# Patient Record
Sex: Female | Born: 1945
Health system: Southern US, Community
[De-identification: ages and names within clinical notes are randomized; demographics above are authoritative.]

## PROBLEM LIST (undated history)

## (undated) DIAGNOSIS — R519 Headache, unspecified: Secondary | ICD-10-CM

## (undated) DIAGNOSIS — M353 Polymyalgia rheumatica: Secondary | ICD-10-CM

## (undated) DIAGNOSIS — M722 Plantar fascial fibromatosis: Secondary | ICD-10-CM

## (undated) DIAGNOSIS — I7300 Raynaud's syndrome without gangrene: Secondary | ICD-10-CM

## (undated) DIAGNOSIS — R319 Hematuria, unspecified: Secondary | ICD-10-CM

## (undated) DIAGNOSIS — K635 Polyp of colon: Secondary | ICD-10-CM

## (undated) DIAGNOSIS — F419 Anxiety disorder, unspecified: Secondary | ICD-10-CM

## (undated) DIAGNOSIS — M810 Age-related osteoporosis without current pathological fracture: Secondary | ICD-10-CM

## (undated) DIAGNOSIS — C50919 Malignant neoplasm of unspecified site of unspecified female breast: Secondary | ICD-10-CM

## (undated) DIAGNOSIS — K219 Gastro-esophageal reflux disease without esophagitis: Secondary | ICD-10-CM

## (undated) DIAGNOSIS — C801 Malignant (primary) neoplasm, unspecified: Secondary | ICD-10-CM

## (undated) DIAGNOSIS — R42 Dizziness and giddiness: Secondary | ICD-10-CM

## (undated) DIAGNOSIS — M1990 Unspecified osteoarthritis, unspecified site: Secondary | ICD-10-CM

## (undated) DIAGNOSIS — Z923 Personal history of irradiation: Secondary | ICD-10-CM

## (undated) HISTORY — DX: Plantar fascial fibromatosis: M72.2

## (undated) HISTORY — PX: HAND SURGERY: SHX662

## (undated) HISTORY — PX: TUBAL LIGATION: SHX77

## (undated) HISTORY — PX: NASAL SINUS SURGERY: SHX719

## (undated) HISTORY — PX: LYMPH NODE DISSECTION: SHX5087

## (undated) HISTORY — DX: Polyp of colon: K63.5

---

## 1996-04-29 HISTORY — PX: BREAST EXCISIONAL BIOPSY: SUR124

## 1999-07-17 ENCOUNTER — Ambulatory Visit (HOSPITAL_BASED_OUTPATIENT_CLINIC_OR_DEPARTMENT_OTHER): Admission: RE | Admit: 1999-07-17 | Discharge: 1999-07-17 | Payer: Self-pay | Admitting: Orthopedic Surgery

## 2001-04-30 ENCOUNTER — Other Ambulatory Visit: Admission: RE | Admit: 2001-04-30 | Discharge: 2001-04-30 | Payer: Self-pay | Admitting: Family Medicine

## 2004-09-26 ENCOUNTER — Ambulatory Visit: Payer: Self-pay | Admitting: Family Medicine

## 2005-06-05 ENCOUNTER — Ambulatory Visit: Payer: Self-pay | Admitting: Cardiology

## 2005-10-21 ENCOUNTER — Ambulatory Visit: Payer: Self-pay

## 2005-10-23 ENCOUNTER — Ambulatory Visit: Payer: Self-pay

## 2006-03-06 ENCOUNTER — Ambulatory Visit: Payer: Self-pay | Admitting: Gastroenterology

## 2006-03-06 LAB — HM COLONOSCOPY: HM Colonoscopy: NORMAL

## 2006-11-05 ENCOUNTER — Ambulatory Visit: Payer: Self-pay | Admitting: Family Medicine

## 2007-11-25 LAB — HM PAP SMEAR: HM Pap smear: NORMAL

## 2008-01-11 ENCOUNTER — Ambulatory Visit: Payer: Self-pay

## 2008-04-14 ENCOUNTER — Ambulatory Visit: Payer: Self-pay | Admitting: Gastroenterology

## 2008-05-10 ENCOUNTER — Ambulatory Visit: Payer: Self-pay | Admitting: Gastroenterology

## 2008-07-20 ENCOUNTER — Ambulatory Visit: Payer: Self-pay | Admitting: Family Medicine

## 2008-08-16 ENCOUNTER — Ambulatory Visit: Payer: Self-pay | Admitting: Otolaryngology

## 2008-08-18 ENCOUNTER — Ambulatory Visit: Payer: Self-pay | Admitting: Otolaryngology

## 2009-02-21 ENCOUNTER — Ambulatory Visit: Payer: Self-pay | Admitting: Family Medicine

## 2009-02-21 LAB — HM DEXA SCAN

## 2009-03-06 ENCOUNTER — Ambulatory Visit: Payer: Self-pay | Admitting: Family Medicine

## 2010-07-11 ENCOUNTER — Ambulatory Visit: Payer: Self-pay | Admitting: Family Medicine

## 2010-07-11 LAB — HM MAMMOGRAPHY: HM Mammogram: NORMAL

## 2010-12-20 ENCOUNTER — Ambulatory Visit: Payer: Self-pay | Admitting: Family Medicine

## 2011-07-04 ENCOUNTER — Ambulatory Visit: Payer: Self-pay | Admitting: Family Medicine

## 2011-08-14 ENCOUNTER — Ambulatory Visit: Payer: Self-pay | Admitting: Family Medicine

## 2012-06-01 DIAGNOSIS — N319 Neuromuscular dysfunction of bladder, unspecified: Secondary | ICD-10-CM | POA: Insufficient documentation

## 2012-06-01 DIAGNOSIS — R300 Dysuria: Secondary | ICD-10-CM | POA: Insufficient documentation

## 2012-06-01 DIAGNOSIS — N3020 Other chronic cystitis without hematuria: Secondary | ICD-10-CM | POA: Insufficient documentation

## 2012-06-03 DIAGNOSIS — N3946 Mixed incontinence: Secondary | ICD-10-CM | POA: Insufficient documentation

## 2012-06-03 DIAGNOSIS — R339 Retention of urine, unspecified: Secondary | ICD-10-CM | POA: Insufficient documentation

## 2012-06-18 ENCOUNTER — Ambulatory Visit: Payer: Self-pay | Admitting: Family Medicine

## 2012-12-09 ENCOUNTER — Ambulatory Visit: Payer: Self-pay | Admitting: Family Medicine

## 2013-03-02 ENCOUNTER — Emergency Department: Payer: Self-pay | Admitting: Emergency Medicine

## 2013-03-02 LAB — COMPREHENSIVE METABOLIC PANEL
Alkaline Phosphatase: 67 U/L (ref 50–136)
BUN: 14 mg/dL (ref 7–18)
Bilirubin,Total: 0.3 mg/dL (ref 0.2–1.0)
Calcium, Total: 9.8 mg/dL (ref 8.5–10.1)
Chloride: 107 mmol/L (ref 98–107)
Co2: 25 mmol/L (ref 21–32)
Creatinine: 0.89 mg/dL (ref 0.60–1.30)
EGFR (African American): >60
Osmolality: 278 (ref 275–301)
Potassium: 3.5 mmol/L (ref 3.5–5.1)
SGOT(AST): 23 U/L (ref 15–37)
SGPT (ALT): 25 U/L (ref 12–78)
Sodium: 138 mmol/L (ref 136–145)
Total Protein: 7.4 g/dL (ref 6.4–8.2)

## 2013-03-02 LAB — CBC
HCT: 34.1 % — ABNORMAL LOW (ref 35.0–47.0)
HGB: 11.8 g/dL — ABNORMAL LOW (ref 12.0–16.0)
MCH: 31.7 pg (ref 26.0–34.0)
MCHC: 34.7 g/dL (ref 32.0–36.0)
MCV: 91 fL (ref 80–100)
Platelet: 196 10*3/uL (ref 150–440)
RBC: 3.73 10*6/uL — ABNORMAL LOW (ref 3.80–5.20)
WBC: 6.5 10*3/uL (ref 3.6–11.0)

## 2013-03-02 LAB — TROPONIN I: Troponin-I: <0.02 ng/mL

## 2013-03-02 LAB — CK TOTAL AND CKMB (NOT AT ARMC): CK, Total: 167 U/L (ref 21–215)

## 2013-10-26 ENCOUNTER — Ambulatory Visit: Payer: Self-pay | Admitting: Family Medicine

## 2014-01-13 LAB — HEPATIC FUNCTION PANEL
ALK PHOS: 47 U/L (ref 25–125)
ALT: 12 U/L (ref 7–35)
AST: 13 U/L (ref 13–35)

## 2014-01-13 LAB — BASIC METABOLIC PANEL
BUN: 17 mg/dL (ref 4–21)
CREATININE: 0.7 mg/dL (ref 0.5–1.1)
GLUCOSE: 96 mg/dL
POTASSIUM: 4.2 mmol/L (ref 3.4–5.3)
SODIUM: 141 mmol/L (ref 137–147)

## 2014-01-13 LAB — CBC AND DIFFERENTIAL
HCT: 35 % — AB (ref 36–46)
HEMOGLOBIN: 11.3 g/dL — AB (ref 12.0–16.0)
Neutrophils Absolute: 2 /uL
Platelets: 218 10*3/uL (ref 150–399)
WBC: 5.1 10^3/mL

## 2014-01-13 LAB — LIPID PANEL
Cholesterol: 170 mg/dL (ref 0–200)
HDL: 70 mg/dL (ref 35–70)
LDL Cholesterol: 76 mg/dL
LDL/HDL RATIO: 1.1
TRIGLYCERIDES: 120 mg/dL (ref 40–160)

## 2014-01-13 LAB — TSH: TSH: 2.85 u[IU]/mL (ref 0.41–5.90)

## 2014-03-01 ENCOUNTER — Ambulatory Visit: Payer: Self-pay | Admitting: Family Medicine

## 2014-09-07 ENCOUNTER — Other Ambulatory Visit: Payer: Self-pay | Admitting: Specialist

## 2014-09-07 DIAGNOSIS — M25562 Pain in left knee: Secondary | ICD-10-CM

## 2014-09-14 ENCOUNTER — Ambulatory Visit
Admission: RE | Admit: 2014-09-14 | Discharge: 2014-09-14 | Disposition: A | Discharge status: Home / Self Care | Payer: Medicare Other | Source: Ambulatory Visit | Attending: Specialist | Admitting: Specialist

## 2014-09-14 DIAGNOSIS — M25462 Effusion, left knee: Secondary | ICD-10-CM | POA: Diagnosis not present

## 2014-09-14 DIAGNOSIS — M25562 Pain in left knee: Secondary | ICD-10-CM

## 2014-09-14 DIAGNOSIS — S83242A Other tear of medial meniscus, current injury, left knee, initial encounter: Secondary | ICD-10-CM | POA: Insufficient documentation

## 2014-09-14 DIAGNOSIS — M948X6 Other specified disorders of cartilage, lower leg: Secondary | ICD-10-CM | POA: Insufficient documentation

## 2014-10-24 DIAGNOSIS — R350 Frequency of micturition: Secondary | ICD-10-CM | POA: Insufficient documentation

## 2014-10-24 DIAGNOSIS — M791 Myalgia, unspecified site: Secondary | ICD-10-CM | POA: Insufficient documentation

## 2014-10-24 DIAGNOSIS — N390 Urinary tract infection, site not specified: Secondary | ICD-10-CM

## 2014-10-24 DIAGNOSIS — D649 Anemia, unspecified: Secondary | ICD-10-CM | POA: Insufficient documentation

## 2014-10-24 DIAGNOSIS — R319 Hematuria, unspecified: Secondary | ICD-10-CM | POA: Insufficient documentation

## 2014-10-24 DIAGNOSIS — M1990 Unspecified osteoarthritis, unspecified site: Secondary | ICD-10-CM | POA: Insufficient documentation

## 2014-10-24 DIAGNOSIS — F419 Anxiety disorder, unspecified: Secondary | ICD-10-CM | POA: Insufficient documentation

## 2014-10-24 DIAGNOSIS — R7989 Other specified abnormal findings of blood chemistry: Secondary | ICD-10-CM | POA: Insufficient documentation

## 2014-10-24 DIAGNOSIS — G43909 Migraine, unspecified, not intractable, without status migrainosus: Secondary | ICD-10-CM | POA: Insufficient documentation

## 2014-10-24 DIAGNOSIS — F32A Depression, unspecified: Secondary | ICD-10-CM | POA: Insufficient documentation

## 2014-10-24 DIAGNOSIS — B9620 Unspecified Escherichia coli [E. coli] as the cause of diseases classified elsewhere: Secondary | ICD-10-CM | POA: Insufficient documentation

## 2014-10-24 DIAGNOSIS — D229 Melanocytic nevi, unspecified: Secondary | ICD-10-CM | POA: Insufficient documentation

## 2014-10-24 DIAGNOSIS — F329 Major depressive disorder, single episode, unspecified: Secondary | ICD-10-CM | POA: Insufficient documentation

## 2014-10-24 DIAGNOSIS — I7300 Raynaud's syndrome without gangrene: Secondary | ICD-10-CM | POA: Insufficient documentation

## 2014-10-24 DIAGNOSIS — H6980 Other specified disorders of Eustachian tube, unspecified ear: Secondary | ICD-10-CM | POA: Insufficient documentation

## 2014-10-24 DIAGNOSIS — N059 Unspecified nephritic syndrome with unspecified morphologic changes: Secondary | ICD-10-CM | POA: Insufficient documentation

## 2014-10-24 DIAGNOSIS — R42 Dizziness and giddiness: Secondary | ICD-10-CM | POA: Insufficient documentation

## 2014-10-24 DIAGNOSIS — E7800 Pure hypercholesterolemia, unspecified: Secondary | ICD-10-CM | POA: Insufficient documentation

## 2014-10-24 DIAGNOSIS — S161XXA Strain of muscle, fascia and tendon at neck level, initial encounter: Secondary | ICD-10-CM | POA: Insufficient documentation

## 2014-10-27 ENCOUNTER — Telehealth: Payer: Self-pay | Admitting: Family Medicine

## 2014-10-27 NOTE — Telephone Encounter (Signed)
Pt having back pain and wants something called in that Dolores Frame use to call in for her.  Pt Cb # [phone]  tp

## 2014-10-27 NOTE — Telephone Encounter (Signed)
Left a voicemail advising patient per Simona Huh she will need a OV to evaluate back pain.

## 2014-10-28 ENCOUNTER — Telehealth: Payer: Self-pay | Admitting: Family Medicine

## 2014-10-28 ENCOUNTER — Other Ambulatory Visit: Payer: Self-pay

## 2014-10-28 DIAGNOSIS — M1990 Unspecified osteoarthritis, unspecified site: Secondary | ICD-10-CM

## 2014-10-28 MED ORDER — CARISOPRODOL 350 MG PO TABS: ORAL_TABLET | ORAL | Status: DC

## 2014-10-28 NOTE — Telephone Encounter (Signed)
Called pt to advised RX was ready for pick up. Pt request it be faxed to [facility] per Raquel Sarna we can not fax but can call it in and Raquel Sarna stated she will call in the RX for Soma 350 mg. Pt advised. Thanks TNP

## 2014-11-10 ENCOUNTER — Ambulatory Visit: Payer: Self-pay | Admitting: Family Medicine

## 2015-01-04 ENCOUNTER — Ambulatory Visit (INDEPENDENT_AMBULATORY_CARE_PROVIDER_SITE_OTHER): Payer: Medicare Other | Admitting: Family Medicine

## 2015-01-04 ENCOUNTER — Encounter: Payer: Self-pay | Admitting: Family Medicine

## 2015-01-04 VITALS — BP 122/68 | HR 72 | Temp 98.2°F | Resp 16 | Ht 61.5 in | Wt 119.2 lb

## 2015-01-04 DIAGNOSIS — G4700 Insomnia, unspecified: Secondary | ICD-10-CM | POA: Diagnosis not present

## 2015-01-04 DIAGNOSIS — R5383 Other fatigue: Secondary | ICD-10-CM

## 2015-01-04 DIAGNOSIS — M722 Plantar fascial fibromatosis: Secondary | ICD-10-CM | POA: Diagnosis not present

## 2015-01-04 DIAGNOSIS — Z23 Encounter for immunization: Secondary | ICD-10-CM | POA: Diagnosis not present

## 2015-01-04 DIAGNOSIS — Z0000 Encounter for general adult medical examination without abnormal findings: Secondary | ICD-10-CM | POA: Diagnosis not present

## 2015-01-04 DIAGNOSIS — E785 Hyperlipidemia, unspecified: Secondary | ICD-10-CM | POA: Diagnosis not present

## 2015-01-04 DIAGNOSIS — F32A Depression, unspecified: Secondary | ICD-10-CM

## 2015-01-04 DIAGNOSIS — F329 Major depressive disorder, single episode, unspecified: Secondary | ICD-10-CM | POA: Diagnosis not present

## 2015-01-04 LAB — POCT URINALYSIS DIPSTICK
Bilirubin, UA: NEGATIVE
GLUCOSE UA: NEGATIVE
Ketones, UA: NEGATIVE
Leukocytes, UA: NEGATIVE
NITRITE UA: NEGATIVE
PH UA: 6.5
Protein, UA: NEGATIVE
Spec Grav, UA: 1.015
UROBILINOGEN UA: 0.2

## 2015-01-04 NOTE — Progress Notes (Signed)
Patient ID: Sherri Cisneros, female   DOB: Jun 12, 1945, 69 y.o.   MRN: [id number] [name], Female    DOB: [birth year], 69 y.o.   MRN: [id number] Visit Date: 01/04/2015  Today's Provider: Wilhemena Durie, MD   Chief Complaint  Patient presents with  . Annual Exam   Subjective:  Sherri Cisneros is a 69 y.o. female who presents today for health maintenance and complete physical. She feels fairly well. She reports exercising 3 times weekly. She reports she is sleeping well.  Mammo- 03/01/2014  BMD- 07/04/2011 osteopenia  Pap- 07/02/2011 normal with HPV  Colonoscopy- 03/06/2006 normal repeat in 10 years  EKG- 12/20/2010  Patient needs flu shot and prevnar   Review of Systems  Constitutional: Positive for fatigue.  HENT: Positive for sinus pressure and tinnitus.   Eyes: Positive for discharge, redness and itching.  Respiratory: Negative.   Cardiovascular: Positive for leg swelling.  Gastrointestinal: Negative.   Endocrine: Positive for cold intolerance.  Genitourinary: Positive for hematuria.  Musculoskeletal: Positive for myalgias, back pain, joint swelling and arthralgias.  Skin: Negative.   Allergic/Immunologic: Positive for environmental allergies and food allergies.  Neurological: Positive for numbness.  Hematological: Negative.   Psychiatric/Behavioral: Negative.   All other systems reviewed and are negative.   Social History   Social History  . Marital Status: Married    Spouse Name: N/A  . Number of Children: 1  . Years of Education: N/A   Occupational History  . Not on file.   Social History Main Topics  . Smoking status: Never Smoker   . Smokeless tobacco: Not on file  . Alcohol Use: No  . Drug Use: No  . Sexual Activity: Not on file   Other Topics Concern  . Not on file   Social History Narrative    Patient Active Problem List   Diagnosis Date Noted  . Absolute anemia 10/24/2014  . Anxiety 10/24/2014  . Cervical muscle strain 10/24/2014  .  Clinical depression 10/24/2014  . E-coli UTI 10/24/2014  . Elevated TSH 10/24/2014  . Dysfunction of eustachian tube 10/24/2014  . Blood in the urine 10/24/2014  . Hypercholesteremia 10/24/2014  . Major depressive disorder, single episode 10/24/2014  . Migraine 10/24/2014  . Muscle ache 10/24/2014  . Nephritis 10/24/2014  . FOM (frequency of micturition) 10/24/2014  . Head revolving around 10/24/2014  . Raynaud's syndrome 10/24/2014  . Arthritis, degenerative 10/24/2014  . Atypical nevus 10/24/2014  . Bladder infection, chronic 06/01/2012    Past Surgical History  Procedure Laterality Date  . Nasal sinus surgery    . Lymph node dissection      removed from back oh neck  . Hand surgery Right   . Breast biopsy    . Tubal ligation      Her family history includes Alcohol abuse in her sister; Breast cancer in her maternal grandmother; CVA in her mother, paternal grandfather, and paternal grandmother; Heart attack in her father, paternal grandfather, and paternal grandmother; Hypertension in her mother; Seizures in her mother; Stroke in her father; Thyroid disease in her mother.    Outpatient Prescriptions Prior to Visit  Medication Sig Dispense Refill  . Acetaminophen 500 MG coapsule Take by mouth.    . ALPRAZolam (XANAX) 0.5 MG tablet Take by mouth.    Marland Kitchen CALCIUM-VITAMIN D PO Take by mouth.    . carisoprodol (SOMA) 350 MG tablet Take 1 - 1 1/2 Daily as needed 30 tablet 5  . Cetirizine HCl (ZYRTEC  ALLERGY) 10 MG CAPS Take by mouth.    . citalopram (CELEXA) 10 MG tablet Take by mouth.    . fluticasone (FLONASE) 50 MCG/ACT nasal spray Place into the nose.    . meloxicam (MOBIC) 15 MG tablet Take by mouth.    . mirtazapine (REMERON) 30 MG tablet Take by mouth.    . montelukast (SINGULAIR) 10 MG tablet Take 10 mg by mouth at bedtime.    Marland Kitchen omeprazole (PRILOSEC) 20 MG capsule Take by mouth.    . pravastatin (PRAVACHOL) 40 MG tablet Take by mouth.    . ranitidine (ZANTAC) 150 MG  tablet Take by mouth.    . zolpidem (AMBIEN) 10 MG tablet Take by mouth.     No facility-administered medications prior to visit.    Patient Care Team: Jerrol Banana., MD as PCP - General (Family Medicine)     Objective:   Vitals:  Filed Vitals:   01/04/15 0838  BP: 122/68  Pulse: 72  Temp: 98.2 F (36.8 C)  TempSrc: Oral  Resp: 16  Height: 5' 1.5" (1.562 m)  Weight: 119 lb 3.2 oz (54.069 kg)    Physical Exam  Constitutional: She is oriented to person, place, and time. She appears well-developed and well-nourished.  HENT:  Head: Normocephalic and atraumatic.  Right Ear: External ear normal.  Left Ear: External ear normal.  Nose: Nose normal.  Mouth/Throat: Oropharynx is clear and moist.  Eyes: Conjunctivae and EOM are normal. Pupils are equal, round, and reactive to light.  Neck: Normal range of motion. Neck supple.  Cardiovascular: Normal rate, regular rhythm, normal heart sounds and intact distal pulses.   Pulmonary/Chest: Effort normal and breath sounds normal.  Abdominal: Soft. Bowel sounds are normal.  Genitourinary: Vagina normal and uterus normal.  Musculoskeletal: Normal range of motion.  Neurological: She is alert and oriented to person, place, and time. She has normal reflexes.  Skin: Skin is warm and dry.  Psychiatric: She has a normal mood and affect. Her behavior is normal. Judgment and thought content normal.  Nursing note and vitals reviewed.  DRE deferred.  Tenderness over medial plantar fascia   Depression Screen No flowsheet data found.    Assessment & Plan:     Routine Health Maintenance and Physical Exam  Exercise Activities and Dietary recommendations Goals    None      Immunization History  Administered Date(s) Administered  . Pneumococcal Polysaccharide-23 01/21/2012  . Td 12/21/2010  . Tdap 03/07/2014  . Zoster 08/12/2011    Health Maintenance  Topic Date Due  . Hepatitis C Screening  Sep 25, 1945  . MAMMOGRAM   07/10/2012  . PNA vac Low Risk Adult (2 of 2 - PCV13) 01/20/2013  . INFLUENZA VACCINE  11/28/2014  . COLONOSCOPY  03/06/2016  . TETANUS/TDAP  03/07/2024  . DEXA SCAN  Completed  . ZOSTAVAX  Completed      Discussed health benefits of physical activity, and encouraged her to engage in regular exercise appropriate for her age and condition.                Plantar fasciitis                 Left meniscus injury of the knee ------------------------------------------------------------------------------------------------------------

## 2015-01-05 LAB — COMPREHENSIVE METABOLIC PANEL
A/G RATIO: 1.7 (ref 1.1–2.5)
ALT: 13 IU/L (ref 0–32)
AST: 19 IU/L (ref 0–40)
Albumin: 4.2 g/dL (ref 3.6–4.8)
Alkaline Phosphatase: 51 IU/L (ref 39–117)
BUN/Creatinine Ratio: 24 (ref 11–26)
BUN: 17 mg/dL (ref 8–27)
Bilirubin Total: 0.4 mg/dL (ref 0.0–1.2)
CALCIUM: 9.5 mg/dL (ref 8.7–10.3)
CO2: 24 mmol/L (ref 18–29)
CREATININE: 0.72 mg/dL (ref 0.57–1.00)
Chloride: 102 mmol/L (ref 97–108)
GFR, EST AFRICAN AMERICAN: 100 mL/min/{1.73_m2} (ref >59)
GFR, EST NON AFRICAN AMERICAN: 86 mL/min/{1.73_m2} (ref >59)
GLOBULIN, TOTAL: 2.5 g/dL (ref 1.5–4.5)
Glucose: 95 mg/dL (ref 65–99)
Potassium: 4.1 mmol/L (ref 3.5–5.2)
Sodium: 139 mmol/L (ref 134–144)
TOTAL PROTEIN: 6.7 g/dL (ref 6.0–8.5)

## 2015-01-05 LAB — CBC
Hematocrit: 34.1 % (ref 34.0–46.6)
Hemoglobin: 11.2 g/dL (ref 11.1–15.9)
MCH: 30.1 pg (ref 26.6–33.0)
MCHC: 32.8 g/dL (ref 31.5–35.7)
MCV: 92 fL (ref 79–97)
PLATELETS: 199 10*3/uL (ref 150–379)
RBC: 3.72 x10E6/uL — ABNORMAL LOW (ref 3.77–5.28)
RDW: 15 % (ref 12.3–15.4)
WBC: 3.6 10*3/uL (ref 3.4–10.8)

## 2015-01-05 LAB — VITAMIN B12: VITAMIN B 12: 552 pg/mL (ref 211–946)

## 2015-01-05 LAB — LIPID PANEL
CHOLESTEROL TOTAL: 218 mg/dL — AB (ref 100–199)
Chol/HDL Ratio: 2.9 ratio units (ref 0.0–4.4)
HDL: 76 mg/dL (ref >39)
LDL CALC: 122 mg/dL — AB (ref 0–99)
Triglycerides: 101 mg/dL (ref 0–149)
VLDL CHOLESTEROL CAL: 20 mg/dL (ref 5–40)

## 2015-01-05 LAB — TSH: TSH: 1.54 u[IU]/mL (ref 0.450–4.500)

## 2015-01-05 LAB — SEDIMENTATION RATE: Sed Rate: 14 mm/hr (ref 0–40)

## 2015-01-06 ENCOUNTER — Other Ambulatory Visit: Payer: Self-pay | Admitting: Emergency Medicine

## 2015-01-06 MED ORDER — PRAVASTATIN SODIUM 40 MG PO TABS: 40.0000 mg | ORAL_TABLET | Freq: Every day | ORAL | Status: DC

## 2015-01-06 MED ORDER — MELOXICAM 15 MG PO TABS: 15.0000 mg | ORAL_TABLET | Freq: Every day | ORAL | Status: DC

## 2015-01-09 ENCOUNTER — Other Ambulatory Visit: Payer: Self-pay | Admitting: Emergency Medicine

## 2015-01-09 DIAGNOSIS — G4700 Insomnia, unspecified: Secondary | ICD-10-CM

## 2015-01-09 DIAGNOSIS — F419 Anxiety disorder, unspecified: Secondary | ICD-10-CM

## 2015-01-09 MED ORDER — ALPRAZOLAM 0.5 MG PO TABS: 0.5000 mg | ORAL_TABLET | Freq: Three times a day (TID) | ORAL | Status: DC | PRN

## 2015-01-09 MED ORDER — ZOLPIDEM TARTRATE 10 MG PO TABS
10.0000 mg | ORAL_TABLET | Freq: Every evening | ORAL | Status: DC | PRN
Start: 2015-01-09 — End: 2015-02-22

## 2015-02-22 ENCOUNTER — Other Ambulatory Visit: Payer: Self-pay | Admitting: Family Medicine

## 2015-02-22 DIAGNOSIS — G4700 Insomnia, unspecified: Secondary | ICD-10-CM

## 2015-02-22 NOTE — Telephone Encounter (Signed)
Printed, please fax or call in to pharmacy. Thank you.   

## 2015-03-29 ENCOUNTER — Other Ambulatory Visit: Payer: Self-pay | Admitting: Family Medicine

## 2015-03-29 DIAGNOSIS — G4700 Insomnia, unspecified: Secondary | ICD-10-CM

## 2015-04-20 ENCOUNTER — Other Ambulatory Visit: Payer: Self-pay

## 2015-04-20 MED ORDER — CITALOPRAM HYDROBROMIDE 10 MG PO TABS: 10.0000 mg | ORAL_TABLET | Freq: Every day | ORAL | Status: DC

## 2015-04-20 NOTE — Telephone Encounter (Signed)
Patient is requesting a refill on Celexa be sent to [facility].

## 2015-05-17 ENCOUNTER — Other Ambulatory Visit: Payer: Self-pay | Admitting: Family Medicine

## 2015-05-17 DIAGNOSIS — Z1231 Encounter for screening mammogram for malignant neoplasm of breast: Secondary | ICD-10-CM

## 2015-05-18 ENCOUNTER — Other Ambulatory Visit: Payer: Self-pay | Admitting: Family Medicine

## 2015-05-18 ENCOUNTER — Ambulatory Visit
Admission: RE | Admit: 2015-05-18 | Discharge: 2015-05-18 | Disposition: A | Discharge status: Home / Self Care | Payer: Medicare Other | Source: Ambulatory Visit | Attending: Family Medicine | Admitting: Family Medicine

## 2015-05-18 DIAGNOSIS — Z1231 Encounter for screening mammogram for malignant neoplasm of breast: Secondary | ICD-10-CM | POA: Insufficient documentation

## 2015-05-22 ENCOUNTER — Ambulatory Visit (INDEPENDENT_AMBULATORY_CARE_PROVIDER_SITE_OTHER): Payer: Medicare Other

## 2015-05-22 ENCOUNTER — Encounter: Payer: Self-pay | Admitting: Podiatry

## 2015-05-22 ENCOUNTER — Ambulatory Visit (INDEPENDENT_AMBULATORY_CARE_PROVIDER_SITE_OTHER): Payer: Medicare Other | Admitting: Podiatry

## 2015-05-22 VITALS — BP 149/82 | HR 74 | Resp 16

## 2015-05-22 DIAGNOSIS — M79672 Pain in left foot: Secondary | ICD-10-CM

## 2015-05-22 DIAGNOSIS — M722 Plantar fascial fibromatosis: Secondary | ICD-10-CM | POA: Diagnosis not present

## 2015-05-22 MED ORDER — METHYLPREDNISOLONE 4 MG PO TBPK: ORAL_TABLET | ORAL | Status: DC

## 2015-05-22 NOTE — Patient Instructions (Signed)

## 2015-05-22 NOTE — Progress Notes (Signed)
   Subjective:    Patient ID: Sherri Cisneros, female    DOB: 1946-01-14, 70 y.o.   MRN: [id number]  HPI: She presents today as a 70 year old female. She is complaining of left heel pain and states that she hurt her knee several months ago which resulted in her foot pain. She's done nothing to treat foot pain. She's also concerned about discoloration and thickening of the lateral border of the fourth digital nail plate right.  Review of Systems  Constitutional: Positive for activity change and fatigue.  HENT: Positive for sinus pressure and tinnitus.   Endocrine: Positive for cold intolerance.  Genitourinary: Positive for hematuria.  Musculoskeletal: Positive for myalgias, back pain, arthralgias and gait problem.  Skin:       Change in nails  Allergic/Immunologic: Positive for food allergies.  Neurological: Positive for numbness and headaches.  Hematological: Bruises/bleeds easily.  All other systems reviewed and are negative.      Objective:   Physical Exam: 69 year old female vital signs stable alert and oriented 3 in no apparent distress. Pulses are palpable. Neurologic since her was intact. Deep tendon reflexes are intact muscle strength is 5 over 5 dorsiflexion plantar flexion as inverters and everters all intrinsic musculature is intact. Orthopedic evaluation demonstrates all joints distal to the ankle range of motion without crepitation. She does have pain on palpation medial calcaneal tubercle of the left heel. Radiographs taken today do demonstrate soft tissue increase in density at the plantar fascial calcaneal insertion site with a small plantar distally oriented to continue heel spur without fractures. Cutaneous evaluation demonstrates supple well-hydrated cutis no erythema edema cellulitis drainage or odor. Thickening of the lateral border of the nail fourth right more than likely associated with mild adductovarus mallet deformity. Nail dystrophy otherwise diagnosed.          Assessment & Plan:  Assessment: Plantar fasciitis left foot.  Plan: Start her on a Medrol Dosepak to be followed by meloxicam. Discussed appropriate shoe gear stretching initial sizes ice therapy issue modifications. Dispensed plantar fascial brace and the night splint. Also injected the left heel today with Kenalog and local anesthetic. We discussed appropriate shoe gear stretching exercises ice therapy and the etiology pathology conservative versus surgical therapies.

## 2015-05-24 ENCOUNTER — Telehealth: Payer: Self-pay | Admitting: Family Medicine

## 2015-05-24 NOTE — Telephone Encounter (Signed)
Pt advised of normal mammogram. sd

## 2015-05-24 NOTE — Telephone Encounter (Signed)
Pt called needing results fro her mamogram from last week.  Her call back is [phone]  Thanks Con Memos

## 2015-06-19 ENCOUNTER — Ambulatory Visit (INDEPENDENT_AMBULATORY_CARE_PROVIDER_SITE_OTHER): Payer: Medicare Other | Admitting: Podiatry

## 2015-06-19 ENCOUNTER — Encounter: Payer: Self-pay | Admitting: Podiatry

## 2015-06-19 VITALS — BP 137/68 | HR 68 | Resp 16

## 2015-06-19 DIAGNOSIS — M722 Plantar fascial fibromatosis: Secondary | ICD-10-CM | POA: Diagnosis not present

## 2015-06-19 NOTE — Progress Notes (Signed)
She presents today and states that her left heel has approximately 90% improved. She continues conservative therapies including anti-inflammatories on fashion brace and night splint.  Objective: Vital signs are stable alert and oriented 3. Pulses are palpable bilateral. Neurologic sensorium is intact. Much decrease on of pain on palpation medial calcaneal tubercle but is still present.  Assessment: Plantar fasciitis left foot.  Plan: Injected the left heel today with Kenalog and local anesthetic. Suggest that she continue conservative therapies mentioned above and will follow up with her in 1 month

## 2015-07-13 ENCOUNTER — Other Ambulatory Visit: Payer: Self-pay | Admitting: Family Medicine

## 2015-07-17 ENCOUNTER — Ambulatory Visit: Payer: Medicare Other | Admitting: Podiatry

## 2015-09-03 ENCOUNTER — Other Ambulatory Visit: Payer: Self-pay | Admitting: Family Medicine

## 2015-09-11 ENCOUNTER — Other Ambulatory Visit: Payer: Self-pay | Admitting: Family Medicine

## 2015-09-12 ENCOUNTER — Other Ambulatory Visit: Payer: Self-pay | Admitting: Family Medicine

## 2015-10-08 ENCOUNTER — Other Ambulatory Visit: Payer: Self-pay | Admitting: Family Medicine

## 2015-10-09 ENCOUNTER — Other Ambulatory Visit: Payer: Self-pay | Admitting: Family Medicine

## 2015-10-10 NOTE — Telephone Encounter (Signed)
Please review request for XANAX, also PA was denied for Ambien because patient has not tried belsomra, Rozerem. Please review-aa

## 2015-10-11 NOTE — Telephone Encounter (Signed)
Pt called wanting to know if you could please write her RX for ALPRAZolam Sherri Cisneros) 0.5 MG tablet 09/04/15 --  To read 1 tablet daily by mouth but for 90 days.  She does not take them but one time a day.  But needs a 90 day supply.   She uses [facility] [address]  Call back is [phone]

## 2015-10-12 ENCOUNTER — Telehealth: Payer: Self-pay

## 2015-10-12 MED ORDER — ALPRAZOLAM 0.5 MG PO TABS: 0.5000 mg | ORAL_TABLET | Freq: Every evening | ORAL | Status: DC | PRN

## 2015-10-12 NOTE — Telephone Encounter (Signed)
Per Dr. Rosanna Randy refill Xanax for 30 day supply only and patient advised she needs appointment for follow up, appointment made-aa RX called in

## 2015-10-18 ENCOUNTER — Ambulatory Visit: Payer: Self-pay | Admitting: Family Medicine

## 2015-10-18 ENCOUNTER — Encounter: Payer: Self-pay | Admitting: Family Medicine

## 2015-10-18 ENCOUNTER — Ambulatory Visit (INDEPENDENT_AMBULATORY_CARE_PROVIDER_SITE_OTHER): Payer: Medicare Other | Admitting: Family Medicine

## 2015-10-18 VITALS — BP 118/74 | HR 80 | Temp 98.7°F | Resp 16 | Wt 121.0 lb

## 2015-10-18 DIAGNOSIS — G4700 Insomnia, unspecified: Secondary | ICD-10-CM | POA: Diagnosis not present

## 2015-10-18 DIAGNOSIS — F419 Anxiety disorder, unspecified: Secondary | ICD-10-CM | POA: Diagnosis not present

## 2015-10-18 MED ORDER — ALPRAZOLAM 0.5 MG PO TABS: 0.5000 mg | ORAL_TABLET | Freq: Every evening | ORAL | Status: DC | PRN

## 2015-10-18 MED ORDER — ZOLPIDEM TARTRATE 10 MG PO TABS: 10.0000 mg | ORAL_TABLET | Freq: Every evening | ORAL | Status: DC | PRN

## 2015-10-18 NOTE — Progress Notes (Signed)
Patient ID: Sherri Cisneros, female   DOB: 12-30-1945, 70 y.o.   MRN: [id number]       [name] Female    DOB: Dec 14, 1945   70 y.o.   MRN: [id number] Visit Date: 10/18/2015  Today's Provider: Wilhemena Durie, MD   Chief Complaint  Patient presents with  . Insomnia  . Anxiety   Subjective:    Insomnia Primary symptoms: sleep disturbance, difficulty falling asleep, frequent awakening.  The problem occurs nightly. The problem is unchanged. The symptoms are aggravated by anxiety and family issues. Past treatments include medication.  Anxiety Presents for follow-up visit. Symptoms include excessive worry, insomnia and nervous/anxious behavior. Patient reports no decreased concentration or suicidal ideas. Symptoms occur most days. The severity of symptoms is moderate. The symptoms are aggravated by family issues. The patient sleeps 5 hours per night. The quality of sleep is fair. Nighttime awakenings: occasional.   The treatment provided moderate relief. Compliance with prior treatments has been good.       Allergies  Allergen Reactions  . Alendronate     GI side effects  . Actonel  [Risedronate Sodium]     GI side effects  . Clarithromycin   . Erythromycin     Jaundice;  CAN TAKE MACROBID PER PATIENT  . Sulfa Antibiotics     Other reaction(s): OTHER   Current Meds  Medication Sig  . Acetaminophen 500 MG coapsule Take by mouth.  . ALPRAZolam (XANAX) 0.5 MG tablet Take 1 tablet (0.5 mg total) by mouth at bedtime as needed.  Marland Kitchen CALCIUM-VITAMIN D PO Take by mouth.  . Cetirizine HCl (ZYRTEC ALLERGY) 10 MG CAPS Take by mouth.  . citalopram (CELEXA) 10 MG tablet Take 1 tablet (10 mg total) by mouth daily.  . fluticasone (FLONASE) 50 MCG/ACT nasal spray Place into the nose.  . meloxicam (MOBIC) 15 MG tablet Take 1 tablet (15 mg total) by mouth daily.  . montelukast (SINGULAIR) 10 MG tablet Take 10 mg by mouth at bedtime.  Marland Kitchen omeprazole (PRILOSEC) 20 MG capsule TAKE 1 TO  2 CAPSULES BY MOUTH EVERY DAY  . pravastatin (PRAVACHOL) 40 MG tablet Take 1 tablet (40 mg total) by mouth daily.  Marland Kitchen zolpidem (AMBIEN) 10 MG tablet TAKE 1 TABLET BY MOUTH EVERY NIGHT AT BEDTIME AS NEEDED    Review of Systems  Constitutional: Negative.   HENT: Negative.   Eyes: Negative.   Respiratory: Negative.   Cardiovascular: Negative.   Endocrine: Negative.   Allergic/Immunologic: Negative.   Neurological: Negative.   Psychiatric/Behavioral: Positive for sleep disturbance. Negative for suicidal ideas, self-injury and decreased concentration. The patient is nervous/anxious and has insomnia. The patient is not hyperactive.     Social History  Substance Use Topics  . Smoking status: Never Smoker   . Smokeless tobacco: Not on file  . Alcohol Use: No   Objective:   Wt 121 lb (54.885 kg)  Physical Exam  Constitutional: She is oriented to person, place, and time. She appears well-developed and well-nourished.  HENT:  Head: Normocephalic and atraumatic.  Right Ear: External ear normal.  Left Ear: External ear normal.  Nose: Nose normal.  Neck: Neck supple.  Cardiovascular: Normal rate, regular rhythm and normal heart sounds.   Pulmonary/Chest: Effort normal and breath sounds normal.  Abdominal: Soft.  Neurological: She is alert and oriented to person, place, and time.  Skin: Skin is warm and dry.  Psychiatric: She has a normal mood and affect. Her behavior is normal. Judgment  and thought content normal.        Assessment & Plan:    Insomnia Patient now actually sleeps 8 hours when she takes Ambien. GAD Situational, mainly family issues. Stable and somewhat improved.\ HLD GERD I have done the exam and reviewed the above chart and it is accurate to the best of my knowledge.       Richard Cranford Mon, MD  [facility]

## 2015-11-28 ENCOUNTER — Ambulatory Visit (INDEPENDENT_AMBULATORY_CARE_PROVIDER_SITE_OTHER): Payer: Medicare Other | Admitting: Family Medicine

## 2015-11-28 ENCOUNTER — Encounter: Payer: Self-pay | Admitting: Family Medicine

## 2015-11-28 VITALS — BP 128/72 | HR 76 | Temp 98.0°F | Resp 16 | Wt 122.0 lb

## 2015-11-28 DIAGNOSIS — J069 Acute upper respiratory infection, unspecified: Secondary | ICD-10-CM

## 2015-11-28 DIAGNOSIS — F419 Anxiety disorder, unspecified: Secondary | ICD-10-CM | POA: Diagnosis not present

## 2015-11-28 MED ORDER — ALPRAZOLAM 0.5 MG PO TABS: 0.5000 mg | ORAL_TABLET | Freq: Every evening | ORAL | 1 refills | Status: DC | PRN

## 2015-11-28 MED ORDER — DOXYCYCLINE HYCLATE 100 MG PO TABS: 100.0000 mg | ORAL_TABLET | Freq: Two times a day (BID) | ORAL | 0 refills | Status: DC

## 2015-11-28 NOTE — Progress Notes (Signed)
Subjective:  HPI Pt reports that a few days ago she started having running nose, cough, congestion, sore throat, headaches, and ear fullness.  She reports that her cough is productive with yellowish sputum. She is not short of breath, denies chest pain or fever. She does say that when she coughs her chest burns.   Prior to Admission medications   Medication Sig Start Date End Date Taking? Authorizing Provider  Acetaminophen 500 MG coapsule Take by mouth.   Yes Historical Provider, MD  ALPRAZolam Duanne Moron) 0.5 MG tablet Take 1 tablet (0.5 mg total) by mouth at bedtime as needed. 10/18/15  Yes Jamonte Curfman Maceo Pro., MD  CALCIUM-VITAMIN D PO Take by mouth. 07/09/11  Yes Historical Provider, MD  Cetirizine HCl (ZYRTEC ALLERGY) 10 MG CAPS Take by mouth.   Yes Historical Provider, MD  citalopram (CELEXA) 10 MG tablet Take 1 tablet (10 mg total) by mouth daily. 04/20/15  Yes Varonica Siharath Maceo Pro., MD  fluticasone [facility]) 50 MCG/ACT nasal spray Place into the nose. 10/26/13  Yes Historical Provider, MD  meloxicam (MOBIC) 15 MG tablet Take 1 tablet (15 mg total) by mouth daily. 01/06/15  Yes Alayiah Fontes Maceo Pro., MD  montelukast (SINGULAIR) 10 MG tablet Take 10 mg by mouth at bedtime.   Yes Historical Provider, MD  omeprazole (PRILOSEC) 20 MG capsule TAKE 1 TO 2 CAPSULES BY MOUTH EVERY DAY 09/11/15  Yes Kayne Yuhas Maceo Pro., MD  pravastatin (PRAVACHOL) 40 MG tablet Take 1 tablet (40 mg total) by mouth daily. 01/06/15  Yes Celicia Minahan Maceo Pro., MD  zolpidem (AMBIEN) 10 MG tablet Take 1 tablet (10 mg total) by mouth at bedtime as needed. 10/18/15  Yes Elisse Pennick Maceo Pro., MD    Patient Active Problem List   Diagnosis Date Noted  . Insomnia 02/22/2015  . Absolute anemia 10/24/2014  . Anxiety 10/24/2014  . Cervical muscle strain 10/24/2014  . Clinical depression 10/24/2014  . E-coli UTI 10/24/2014  . Elevated TSH 10/24/2014  . Dysfunction of eustachian tube 10/24/2014  . Blood in the urine  10/24/2014  . Hypercholesteremia 10/24/2014  . Major depressive disorder, single episode 10/24/2014  . Migraine 10/24/2014  . Muscle ache 10/24/2014  . Nephritis 10/24/2014  . FOM (frequency of micturition) 10/24/2014  . Head revolving around 10/24/2014  . Raynaud's syndrome 10/24/2014  . Arthritis, degenerative 10/24/2014  . Atypical nevus 10/24/2014  . Bladder infection, chronic 06/01/2012    History reviewed. No pertinent past medical history.  Social History   Social History  . Marital status: Married    Spouse name: N/A  . Number of children: 1  . Years of education: N/A   Occupational History  . Not on file.   Social History Main Topics  . Smoking status: Never Smoker  . Smokeless tobacco: Never Used  . Alcohol use No  . Drug use: No  . Sexual activity: Not on file   Other Topics Concern  . Not on file   Social History Narrative  . No narrative on file    Allergies  Allergen Reactions  . Actonel  [Risedronate Sodium]     GI side effects  . Alendronate     GI side effects  . Clarithromycin   . Erythromycin     Jaundice;  CAN TAKE MACROBID PER PATIENT  . Sulfa Antibiotics     Other reaction(s): OTHER    Review of Systems  Constitutional: Positive for malaise/fatigue.  Eyes: Negative.   Respiratory: Positive  for cough and sputum production.   Cardiovascular: Negative.   Gastrointestinal: Negative.   Genitourinary: Negative.   Musculoskeletal: Negative.   Skin: Negative.   Neurological: Negative.   Endo/Heme/Allergies: Negative.   Psychiatric/Behavioral: Negative.     Immunization History  Administered Date(s) Administered  . Influenza, High Dose Seasonal PF 01/04/2015  . Pneumococcal Conjugate-13 01/04/2015  . Pneumococcal Polysaccharide-23 01/21/2012  . Td 12/21/2010  . Tdap 03/07/2014  . Zoster 08/12/2011   Objective:  BP 128/72 (BP Location: Left Arm, Patient Position: Sitting, Cuff Size: Normal)   Pulse 76   Temp 98 F (36.7 C)  (Oral)   Resp 16   Wt 122 lb (55.3 kg)   SpO2 100%   BMI 22.68 kg/m   Physical Exam  Constitutional: She is oriented to person, place, and time and well-developed, well-nourished, and in no distress.  HENT:  Head: Normocephalic and atraumatic.  Right Ear: External ear normal.  Left Ear: External ear normal.  Nose: Nose normal.  Mouth/Throat: Oropharynx is clear and moist.  Eyes: Conjunctivae and EOM are normal. Pupils are equal, round, and reactive to light.  Neck: Normal range of motion. Neck supple.  Cardiovascular: Normal rate, regular rhythm, normal heart sounds and intact distal pulses.   Pulmonary/Chest: Effort normal and breath sounds normal.  Abdominal: Soft.  Musculoskeletal: Normal range of motion.  Neurological: She is alert and oriented to person, place, and time. She has normal reflexes. Gait normal. GCS score is 15.  Skin: Skin is warm and dry.  Psychiatric: Mood, memory, affect and judgment normal.    Lab Results  Component Value Date   WBC 3.6 01/04/2015   HGB 11.3 (A) 01/13/2014   HCT 34.1 01/04/2015   PLT 199 01/04/2015   GLUCOSE 95 01/04/2015   CHOL 218 (H) 01/04/2015   TRIG 101 01/04/2015   HDL 76 01/04/2015   LDLCALC 122 (H) 01/04/2015   TSH 1.540 01/04/2015    CMP     Component Value Date/Time   NA 139 01/04/2015 1025   NA 138 03/02/2013 1238   K 4.1 01/04/2015 1025   K 3.5 03/02/2013 1238   CL 102 01/04/2015 1025   CL 107 03/02/2013 1238   CO2 24 01/04/2015 1025   CO2 25 03/02/2013 1238   GLUCOSE 95 01/04/2015 1025   GLUCOSE 135 (H) 03/02/2013 1238   BUN 17 01/04/2015 1025   BUN 14 03/02/2013 1238   CREATININE 0.72 01/04/2015 1025   CREATININE 0.89 03/02/2013 1238   CALCIUM 9.5 01/04/2015 1025   CALCIUM 9.8 03/02/2013 1238   PROT 6.7 01/04/2015 1025   PROT 7.4 03/02/2013 1238   ALBUMIN 4.2 01/04/2015 1025   ALBUMIN 3.7 03/02/2013 1238   AST 19 01/04/2015 1025   AST 23 03/02/2013 1238   ALT 13 01/04/2015 1025   ALT 25 03/02/2013  1238   ALKPHOS 51 01/04/2015 1025   ALKPHOS 67 03/02/2013 1238   BILITOT 0.4 01/04/2015 1025   BILITOT 0.3 03/02/2013 1238   GFRNONAA 86 01/04/2015 1025   GFRNONAA >60 03/02/2013 1238   GFRAA 100 01/04/2015 1025   GFRAA >60 03/02/2013 1238    Assessment and Plan :  1. Upper respiratory infection Given Doxy incase she needs it and does not gt better. Increase fluids.   2. Anxiety Refilled Xanax 3. Chronic insomnia Patient says she is unable to sleep without a sleeping pill.  Patient was seen and examined by Dr. Miguel Aschoff, and noted scribed by Webb Laws, CMA I have done  the exam and reviewed the above chart and it is accurate to the best of my knowledge.  Miguel Aschoff MD [facility] 11/28/2015 3:05 PM

## 2015-12-08 ENCOUNTER — Other Ambulatory Visit: Payer: Self-pay | Admitting: Family Medicine

## 2016-01-09 ENCOUNTER — Encounter: Payer: Medicare Other | Admitting: Family Medicine

## 2016-01-12 ENCOUNTER — Other Ambulatory Visit: Payer: Self-pay | Admitting: Family Medicine

## 2016-01-12 NOTE — Telephone Encounter (Signed)
Dr. Gilbert's pt.   Thanks,   -Laura  

## 2016-02-14 ENCOUNTER — Telehealth: Payer: Self-pay | Admitting: Family Medicine

## 2016-02-14 NOTE — Telephone Encounter (Signed)
LM to see if pt can stay for AWV at 9:45 on 11/2 (after CPE at [place]) j. strack

## 2016-02-28 ENCOUNTER — Other Ambulatory Visit: Payer: Self-pay | Admitting: Family Medicine

## 2016-02-29 ENCOUNTER — Ambulatory Visit (INDEPENDENT_AMBULATORY_CARE_PROVIDER_SITE_OTHER): Payer: Medicare Other | Admitting: Family Medicine

## 2016-02-29 VITALS — BP 141/62 | HR 72 | Temp 97.4°F | Ht 61.0 in | Wt 124.5 lb

## 2016-02-29 VITALS — BP 141/62 | HR 72 | Temp 97.4°F | Ht 61.0 in | Wt 124.8 lb

## 2016-02-29 DIAGNOSIS — E784 Other hyperlipidemia: Secondary | ICD-10-CM | POA: Diagnosis not present

## 2016-02-29 DIAGNOSIS — G4709 Other insomnia: Secondary | ICD-10-CM

## 2016-02-29 DIAGNOSIS — Z0000 Encounter for general adult medical examination without abnormal findings: Secondary | ICD-10-CM

## 2016-02-29 DIAGNOSIS — Z23 Encounter for immunization: Secondary | ICD-10-CM | POA: Diagnosis not present

## 2016-02-29 DIAGNOSIS — Z1211 Encounter for screening for malignant neoplasm of colon: Secondary | ICD-10-CM

## 2016-02-29 DIAGNOSIS — R5383 Other fatigue: Secondary | ICD-10-CM

## 2016-02-29 DIAGNOSIS — E7849 Other hyperlipidemia: Secondary | ICD-10-CM

## 2016-02-29 DIAGNOSIS — Z124 Encounter for screening for malignant neoplasm of cervix: Secondary | ICD-10-CM

## 2016-02-29 MED ORDER — PRAVASTATIN SODIUM 40 MG PO TABS: 40.0000 mg | ORAL_TABLET | Freq: Every day | ORAL | 3 refills | Status: DC

## 2016-02-29 MED ORDER — ZOLPIDEM TARTRATE 10 MG PO TABS: 10.0000 mg | ORAL_TABLET | Freq: Every evening | ORAL | 5 refills | Status: DC | PRN

## 2016-02-29 NOTE — Progress Notes (Signed)
Subjective:   Sherri Cisneros is a 70 y.o. female who presents for Medicare Annual (Subsequent) preventive examination.  Review of Systems:  N/A Cardiac Risk Factors include: advanced age (>26men, >36 women);dyslipidemia     Objective:     Vitals: BP (!) 141/62 (BP Location: Left Arm)   Pulse 72   Temp 97.4 F (36.3 C) (Oral)   Ht 5\' 1"  (1.549 m)   Wt 124 lb 8 oz (56.5 kg)   BMI 23.52 kg/m   Body mass index is 23.52 kg/m.   Tobacco History  Smoking Status  . Never Smoker  Smokeless Tobacco  . Never Used     Counseling given: Not Answered   Past Medical History:  Diagnosis Date  . Plantar fasciitis fall 2016   Past Surgical History:  Procedure Laterality Date  . BREAST BIOPSY Right    negative  . HAND SURGERY Right   . LYMPH NODE DISSECTION     removed from back oh neck  . NASAL SINUS SURGERY    . TUBAL LIGATION     Family History  Problem Relation Age of Onset  . Hypertension Mother   . CVA Mother   . Thyroid disease Mother   . Seizures Mother   . Stroke Father   . Heart attack Father   . Alcohol abuse Sister   . Breast cancer Maternal Grandmother   . Heart attack Paternal Grandmother   . CVA Paternal Grandmother   . CVA Paternal Grandfather   . Heart attack Paternal Grandfather    History  Sexual Activity  . Sexual activity: Not on file    Outpatient Encounter Prescriptions as of 02/29/2016  Medication Sig  . Acetaminophen 500 MG coapsule Take by mouth.  . ALPRAZolam (XANAX) 0.5 MG tablet Take 1 tablet (0.5 mg total) by mouth at bedtime as needed.  Marland Kitchen CALCIUM-VITAMIN D PO Take by mouth.  . Cetirizine HCl (ZYRTEC ALLERGY) 10 MG CAPS Take by mouth.  . fluticasone (FLONASE) 50 MCG/ACT nasal spray Place into the nose.  Marland Kitchen omeprazole (PRILOSEC) 20 MG capsule TAKE 1 TO 2 CAPSULES BY MOUTH EVERY DAY  . pravastatin (PRAVACHOL) 40 MG tablet Take 1 tablet (40 mg total) by mouth daily.  Marland Kitchen zolpidem (AMBIEN) 10 MG tablet TAKE 1 TABLET BY MOUTH EVERY NIGHT  AT BEDTIME AS NEEDED  . citalopram (CELEXA) 10 MG tablet TAKE 1 TABLET(10 MG) BY MOUTH DAILY (Patient not taking: Reported on 02/29/2016)  . doxycycline (VIBRA-TABS) 100 MG tablet Take 1 tablet (100 mg total) by mouth 2 (two) times daily. (Patient not taking: Reported on 02/29/2016)  . meloxicam (MOBIC) 15 MG tablet TAKE 1 TABLET(15 MG) BY MOUTH DAILY  . montelukast (SINGULAIR) 10 MG tablet Take 10 mg by mouth at bedtime.   No facility-administered encounter medications on file as of 02/29/2016.     Activities of Daily Living In your present state of health, do you have any difficulty performing the following activities: 02/29/2016  Hearing? N  Vision? Y  Difficulty concentrating or making decisions? N  Walking or climbing stairs? N  Dressing or bathing? N  Doing errands, shopping? N  Preparing Food and eating ? N  Using the Toilet? N  In the past six months, have you accidently leaked urine? N  Do you have problems with loss of bowel control? N  Managing your Medications? N  Managing your Finances? N  Housekeeping or managing your Housekeeping? N  Some recent data might be hidden  Patient Care Team: Jerrol Banana., MD as PCP - General (Family Medicine) Arelia Sneddon, OD as Consulting Physician (Optometry) [place], DPM as Consulting Physician (Podiatry) Jannet Mantis, MD as Consulting Physician (Dermatology)    Assessment:     Exercise Activities and Dietary recommendations Current Exercise Habits: Structured exercise class, Type of exercise: walking, Time (Minutes): 45, Frequency (Times/Week): 2, Weekly Exercise (Minutes/Week): 90, Intensity: Mild  Goals    . Increase water intake          Starting 02/29/16, I will increase my water intake to 6 glasses a day.      Fall Risk Fall Risk  02/29/2016 01/04/2015  Falls in the past year? No Yes  Number falls in past yr: - 1  Injury with Fall? - Yes   Depression Screen PHQ 2/9 Scores 02/29/2016 01/04/2015    PHQ - 2 Score 1 1     Cognitive Function     6CIT Screen 02/29/2016  What Year? 0 points  What month? 0 points  What time? 0 points  Count back from 20 0 points  Months in reverse 0 points  Repeat phrase 0 points  Total Score 0    Immunization History  Administered Date(s) Administered  . Influenza, High Dose Seasonal PF 01/04/2015, 02/29/2016  . Pneumococcal Conjugate-13 01/04/2015  . Pneumococcal Polysaccharide-23 01/21/2012  . Td 12/21/2010  . Tdap 03/07/2014  . Zoster 08/12/2011   Screening Tests Health Maintenance  Topic Date Due  . COLONOSCOPY  03/06/2016  . MAMMOGRAM  05/17/2017  . TETANUS/TDAP  03/07/2024  . INFLUENZA VACCINE  Completed  . DEXA SCAN  Completed  . ZOSTAVAX  Completed  . Hepatitis C Screening  Completed  . PNA vac Low Risk Adult  Completed      Plan:  I have personally reviewed and addressed the Medicare Annual Wellness questionnaire and have noted the following in the patient's chart:  A. Medical and social history B. Use of alcohol, tobacco or illicit drugs  C. Current medications and supplements D. Functional ability and status E.  Nutritional status F.  Physical activity G. Advance directives H. List of other physicians I.  Hospitalizations, surgeries, and ER visits in previous 12 months J.  [place] such as hearing and vision if needed, cognitive and depression L. Referrals and appointments - none  In addition, I have reviewed and discussed with patient certain preventive protocols, quality metrics, and best practice recommendations. A written personalized care plan for preventive services as well as general preventive health recommendations were provided to patient.  See attached scanned questionnaire for additional information.   Signed,  Fabio Neighbors, LPN Nurse Health Advisor  I have reviewed the information as  put it by [facility] LPN and was available for any questions or concerns. Miguel Aschoff MD [facility]

## 2016-02-29 NOTE — Patient Instructions (Signed)
Ms. Franklyn , Thank you for taking time to come for your Medicare Wellness Visit. I appreciate your ongoing commitment to your health goals. Please review the following plan we discussed and let me know if I can assist you in the future.   These are the goals we discussed: Goals    . Increase water intake          Starting 02/29/16, I will increase my water intake to 6 glasses a day.       This is a list of the screening recommended for you and due dates:  Health Maintenance  Topic Date Due  . Colon Cancer Screening  03/06/2016  . Mammogram  05/17/2017  . Tetanus Vaccine  03/07/2024  . Flu Shot  Completed  . DEXA scan (bone density measurement)  Completed  . Shingles Vaccine  Completed  .  Hepatitis C: One time screening is recommended by Center for Disease Control  (CDC) for  adults born from [birth year] through 1964.   Completed  . Pneumonia vaccines  Completed   Preventive Care for Adults  A healthy lifestyle and preventive care can promote health and wellness. Preventive health guidelines for adults include the following key practices.  . A routine yearly physical is a good way to check with your health care provider about your health and preventive screening. It is a chance to share any concerns and updates on your health and to receive a thorough exam.  . Visit your dentist for a routine exam and preventive care every 6 months. Brush your teeth twice a day and floss once a day. Good oral hygiene prevents tooth decay and gum disease.  . The frequency of eye exams is based on your age, health, family medical history, use  of contact lenses, and other factors. Follow your health care provider's ecommendations for frequency of eye exams.  . Eat a healthy diet. Foods like vegetables, fruits, whole grains, low-fat dairy products, and lean protein foods contain the nutrients you need without too many calories. Decrease your intake of foods high in solid fats, added sugars, and salt. Eat the  right amount of calories for you. Get information about a proper diet from your health care provider, if necessary.  . Regular physical exercise is one of the most important things you can do for your health. Most adults should get at least 150 minutes of moderate-intensity exercise (any activity that increases your heart rate and causes you to sweat) each week. In addition, most adults need muscle-strengthening exercises on 2 or more days a week.  Silver Sneakers may be a benefit available to you. To determine eligibility, you may visit the website: www.silversneakers.com or contact program at 430 145 8504 Mon-Fri between 8AM-8PM.   . Maintain a healthy weight. The body mass index (BMI) is a screening tool to identify possible weight problems. It provides an estimate of body fat based on height and weight. Your health care provider can find your BMI and can help you achieve or maintain a healthy weight.   For adults 20 years and older: ? A BMI below 18.5 is considered underweight. ? A BMI of 18.5 to 24.9 is normal. ? A BMI of 25 to 29.9 is considered overweight. ? A BMI of 30 and above is considered obese.   . Maintain normal blood lipids and cholesterol levels by exercising and minimizing your intake of saturated fat. Eat a balanced diet with plenty of fruit and vegetables. Blood tests for lipids and cholesterol should begin  at age 55 and be repeated every 5 years. If your lipid or cholesterol levels are high, you are over 50, or you are at high risk for heart disease, you may need your cholesterol levels checked more frequently. Ongoing high lipid and cholesterol levels should be treated with medicines if diet and exercise are not working.  . If you smoke, find out from your health care provider how to quit. If you do not use tobacco, please do not start.  . If you choose to drink alcohol, please do not consume more than 2 drinks per day. One drink is considered to be 12 ounces (355 mL) of  beer, 5 ounces (148 mL) of wine, or 1.5 ounces (44 mL) of liquor.  . If you are 50-55 years old, ask your health care provider if you should take aspirin to prevent strokes.  . Use sunscreen. Apply sunscreen liberally and repeatedly throughout the day. You should seek shade when your shadow is shorter than you. Protect yourself by wearing long sleeves, pants, a wide-brimmed hat, and sunglasses year round, whenever you are outdoors.  . Once a month, do a whole body skin exam, using a mirror to look at the skin on your back. Tell your health care provider of new moles, moles that have irregular borders, moles that are larger than a pencil eraser, or moles that have changed in shape or color.

## 2016-02-29 NOTE — Progress Notes (Signed)
[name], Female    DOB: 12/15/1945, 70 y.o.   MRN: [id number] Visit Date: 02/29/2016  Today's Provider: Wilhemena Durie, MD   Chief Complaint  Patient presents with  . Medicare Wellness   Subjective:   Sherri Cisneros is a 70 y.o. female who presents today for her Subsequent Annual Wellness Visit. She feels well. She reports exercising walking. She reports she is sleeping well. Immunization History  Administered Date(s) Administered  . Influenza, High Dose Seasonal PF 01/04/2015  . Pneumococcal Conjugate-13 01/04/2015  . Pneumococcal Polysaccharide-23 01/21/2012  . Td 12/21/2010  . Tdap 03/07/2014  . Zoster 08/12/2011   Last Colonoscopy 03/06/06 repeat 10 years all was normal  Pap smear 07/12/11 with HPV all normal  Mammogram 05/18/15  BMD 07/14/11 osteopenia Review of Systems  Constitutional: Positive for fatigue.  HENT: Negative.   Eyes: Negative.   Respiratory: Negative.   Cardiovascular: Negative.   Gastrointestinal: Negative.   Endocrine: Negative.   Genitourinary: Positive for hematuria.  Musculoskeletal: Positive for arthralgias, back pain and neck stiffness.  Skin: Negative.   Allergic/Immunologic: Positive for environmental allergies.  Neurological: Negative.   Hematological: Negative.   Psychiatric/Behavioral: Negative.     Patient Active Problem List   Diagnosis Date Noted  . Insomnia 02/22/2015  . Absolute anemia 10/24/2014  . Anxiety 10/24/2014  . Cervical muscle strain 10/24/2014  . Clinical depression 10/24/2014  . E-coli UTI 10/24/2014  . Elevated TSH 10/24/2014  . Dysfunction of eustachian tube 10/24/2014  . Blood in the urine 10/24/2014  . Hypercholesteremia 10/24/2014  . Major depressive disorder, single episode 10/24/2014  . Migraine 10/24/2014  . Muscle ache 10/24/2014  . Nephritis 10/24/2014  . FOM (frequency of micturition) 10/24/2014  . Head revolving around 10/24/2014  . Raynaud's syndrome 10/24/2014  . Arthritis,  degenerative 10/24/2014  . Atypical nevus 10/24/2014  . Bladder infection, chronic 06/01/2012    Social History   Social History  . Marital status: Married    Spouse name: N/A  . Number of children: 1  . Years of education: N/A   Occupational History  . Not on file.   Social History Main Topics  . Smoking status: Never Smoker  . Smokeless tobacco: Never Used  . Alcohol use 1.2 - 1.8 oz/week    2 - 3 Glasses of wine per week  . Drug use: No  . Sexual activity: Not on file   Other Topics Concern  . Not on file   Social History Narrative  . No narrative on file    Past Surgical History:  Procedure Laterality Date  . BREAST BIOPSY Right    negative  . HAND SURGERY Right   . LYMPH NODE DISSECTION     removed from back oh neck  . NASAL SINUS SURGERY    . TUBAL LIGATION      Her family history includes Alcohol abuse in her sister; Breast cancer in her maternal grandmother; CVA in her mother, paternal grandfather, and paternal grandmother; Heart attack in her father, paternal grandfather, and paternal grandmother; Hypertension in her mother; Seizures in her mother; Stroke in her father; Thyroid disease in her mother.    Outpatient Encounter Prescriptions as of 02/29/2016  Medication Sig Note  . Acetaminophen 500 MG coapsule Take by mouth. 10/22/2014: Medication taken as needed.  Received from: [facility]  . ALPRAZolam (XANAX) 0.5 MG tablet Take 1 tablet (0.5 mg total) by mouth at bedtime as needed.   Marland Kitchen CALCIUM-VITAMIN D PO Take  by mouth. 10/22/2014: Received from: [facility]  . Cetirizine HCl (ZYRTEC ALLERGY) 10 MG CAPS Take by mouth. 10/22/2014: Received from: [facility]  . citalopram (CELEXA) 10 MG tablet TAKE 1 TABLET(10 MG) BY MOUTH DAILY (Patient not taking: Reported on 02/29/2016)   . doxycycline (VIBRA-TABS) 100 MG tablet Take 1 tablet (100 mg total) by mouth 2 (two) times daily. (Patient not taking: Reported on  02/29/2016)   . fluticasone (FLONASE) 50 MCG/ACT nasal spray Place into the nose. 10/22/2014: Received from: [facility]  . meloxicam (MOBIC) 15 MG tablet TAKE 1 TABLET(15 MG) BY MOUTH DAILY   . montelukast (SINGULAIR) 10 MG tablet Take 10 mg by mouth at bedtime.   Marland Kitchen omeprazole (PRILOSEC) 20 MG capsule TAKE 1 TO 2 CAPSULES BY MOUTH EVERY DAY   . pravastatin (PRAVACHOL) 40 MG tablet Take 1 tablet (40 mg total) by mouth daily.   Marland Kitchen zolpidem (AMBIEN) 10 MG tablet TAKE 1 TABLET BY MOUTH EVERY NIGHT AT BEDTIME AS NEEDED    No facility-administered encounter medications on file as of 02/29/2016.     Allergies  Allergen Reactions  . Actonel  [Risedronate Sodium]     GI side effects  . Alendronate     GI side effects  . Clarithromycin   . Erythromycin     Jaundice;  CAN TAKE MACROBID PER PATIENT  . Sulfa Antibiotics     Other reaction(s): OTHER    Patient Care Team: Jerrol Banana., MD as PCP - General (Family Medicine) Arelia Sneddon, OD as Consulting Physician (Optometry) [place], DPM as Consulting Physician (Podiatry) Jannet Mantis, MD as Consulting Physician (Dermatology)  Objective:   Vitals:  Vitals:   02/29/16 0942  BP: (!) 141/62  Pulse: 72  Temp: 97.4 F (36.3 C)  Weight: 124 lb 12.8 oz (56.6 kg)  Height: 5\' 1"  (1.549 m)    Physical Exam  Constitutional: She is oriented to person, place, and time. She appears well-developed and well-nourished.  HENT:  Head: Normocephalic and atraumatic.  Right Ear: External ear normal.  Left Ear: External ear normal.  Nose: Nose normal.  Mouth/Throat: Oropharynx is clear and moist.  Eyes: Conjunctivae are normal. Pupils are equal, round, and reactive to light.  Neck: Normal range of motion. Neck supple.  Cardiovascular: Normal rate, regular rhythm, normal heart sounds and intact distal pulses.   No murmur heard. Pulmonary/Chest: Effort normal and breath sounds normal. No respiratory distress.  She has no wheezes.  Abdominal: Soft. She exhibits no distension. There is no tenderness.  Genitourinary: Vagina normal and uterus normal. No vaginal discharge found.  Genitourinary Comments: Mild rectocele  Musculoskeletal: She exhibits no edema or tenderness.  Neurological: She is alert and oriented to person, place, and time. No cranial nerve deficit.  Skin: Skin is warm. No rash noted. No erythema.  Psychiatric: She has a normal mood and affect. Her behavior is normal. Judgment and thought content normal.    Activities of Daily Living In your present state of health, do you have any difficulty performing the following activities: 02/29/2016  Hearing? N  Vision? Y  Difficulty concentrating or making decisions? N  Walking or climbing stairs? N  Dressing or bathing? N  Doing errands, shopping? N  Preparing Food and eating ? N  Using the Toilet? N  In the past six months, have you accidently leaked urine? N  Do you have problems with loss of bowel control? N  Managing your Medications? N  Managing your Finances? N  Housekeeping or managing your Housekeeping? N  Some recent data might be hidden    Fall Risk Assessment Fall Risk  02/29/2016 01/04/2015  Falls in the past year? No Yes  Number falls in past yr: - 1  Injury with Fall? - Yes     Depression Screen PHQ 2/9 Scores 02/29/2016 01/04/2015  PHQ - 2 Score 1 1    Cognitive Testing - 6-CIT    Year: 0 4 points  Month: 0 3 points  Memorize "Keirst[place], [place], [address], [place]"  Time (within 1 hour:) 0 3 points  Count backwards from 20: 0 2 4 points  Name months of year: 0 2 4 points  Repeat Address: 0 2 4 6 8 10  points   Total Score: 0/28  Interpretation : Normal (0-7) Abnormal (8-28)    Assessment & Plan:     Annual Wellness Visit  Reviewed patient's Family Medical History Reviewed and updated list of patient's medical providers Assessment of cognitive impairment was done Assessed patient's functional  ability Established a written schedule for health screening [place] Completed and Reviewed  1. Medicare annual wellness visit, subsequent  2. Colon cancer screening Refer for colonoscopy - Ambulatory referral to Gastroenterology  3. Pap smear for cervical cancer screening - Pap IG ([facility])  4. Other insomnia Able. Patient trying to take less Ambien. Refill for Ambien provided. - TSH  5. Other hyperlipidemia - Comprehensive metabolic panel - Lipid Panel With LDL/HDL Ratio  6. Other fatigue - CBC w/Diff/Platelet - Comprehensive metabolic panel - TSH  HPI, Exam and A&P transcribed under direction and in the presence of Miguel Aschoff, MD. I have done the exam and reviewed the chart and it is accurate to the best of my knowledge. Miguel Aschoff M.D. [facility]

## 2016-03-01 LAB — LIPID PANEL WITH LDL/HDL RATIO
CHOLESTEROL TOTAL: 183 mg/dL (ref 100–199)
HDL: 70 mg/dL (ref >39)
LDL Calculated: 84 mg/dL (ref 0–99)
LDl/HDL Ratio: 1.2 ratio units (ref 0.0–3.2)
TRIGLYCERIDES: 147 mg/dL (ref 0–149)
VLDL Cholesterol Cal: 29 mg/dL (ref 5–40)

## 2016-03-01 LAB — CBC WITH DIFFERENTIAL/PLATELET
BASOS: 1 %
Basophils Absolute: 0 10*3/uL (ref 0.0–0.2)
EOS (ABSOLUTE): 0.2 10*3/uL (ref 0.0–0.4)
EOS: 3 %
HEMATOCRIT: 32.3 % — AB (ref 34.0–46.6)
Hemoglobin: 10.8 g/dL — ABNORMAL LOW (ref 11.1–15.9)
Immature Grans (Abs): 0 10*3/uL (ref 0.0–0.1)
Immature Granulocytes: 0 %
LYMPHS ABS: 2.8 10*3/uL (ref 0.7–3.1)
Lymphs: 47 %
MCH: 29.8 pg (ref 26.6–33.0)
MCHC: 33.4 g/dL (ref 31.5–35.7)
MCV: 89 fL (ref 79–97)
MONOS ABS: 0.5 10*3/uL (ref 0.1–0.9)
Monocytes: 9 %
NEUTROS ABS: 2.4 10*3/uL (ref 1.4–7.0)
Neutrophils: 40 %
Platelets: 218 10*3/uL (ref 150–379)
RBC: 3.62 x10E6/uL — AB (ref 3.77–5.28)
RDW: 16.1 % — AB (ref 12.3–15.4)
WBC: 5.9 10*3/uL (ref 3.4–10.8)

## 2016-03-01 LAB — COMPREHENSIVE METABOLIC PANEL
A/G RATIO: 1.6 (ref 1.2–2.2)
ALK PHOS: 52 IU/L (ref 39–117)
ALT: 16 IU/L (ref 0–32)
AST: 16 IU/L (ref 0–40)
Albumin: 4.1 g/dL (ref 3.6–4.8)
BILIRUBIN TOTAL: 0.3 mg/dL (ref 0.0–1.2)
BUN / CREAT RATIO: 25 (ref 12–28)
BUN: 18 mg/dL (ref 8–27)
CO2: 25 mmol/L (ref 18–29)
Calcium: 9.1 mg/dL (ref 8.7–10.3)
Chloride: 104 mmol/L (ref 96–106)
Creatinine, Ser: 0.73 mg/dL (ref 0.57–1.00)
GFR calc Af Amer: 97 mL/min/{1.73_m2} (ref >59)
GFR calc non Af Amer: 84 mL/min/{1.73_m2} (ref >59)
GLOBULIN, TOTAL: 2.6 g/dL (ref 1.5–4.5)
Glucose: 95 mg/dL (ref 65–99)
POTASSIUM: 4.2 mmol/L (ref 3.5–5.2)
SODIUM: 143 mmol/L (ref 134–144)
Total Protein: 6.7 g/dL (ref 6.0–8.5)

## 2016-03-01 LAB — TSH: TSH: 2.32 u[IU]/mL (ref 0.450–4.500)

## 2016-03-04 LAB — PAP IG (IMAGE GUIDED): PAP SMEAR COMMENT: 0

## 2016-03-19 ENCOUNTER — Telehealth: Payer: Self-pay

## 2016-03-19 ENCOUNTER — Encounter: Payer: Self-pay | Admitting: Physician Assistant

## 2016-03-19 ENCOUNTER — Ambulatory Visit (INDEPENDENT_AMBULATORY_CARE_PROVIDER_SITE_OTHER): Payer: Medicare Other | Admitting: Physician Assistant

## 2016-03-19 VITALS — BP 128/82 | HR 72 | Temp 98.0°F | Resp 16 | Wt 125.0 lb

## 2016-03-19 DIAGNOSIS — N3020 Other chronic cystitis without hematuria: Secondary | ICD-10-CM

## 2016-03-19 DIAGNOSIS — K219 Gastro-esophageal reflux disease without esophagitis: Secondary | ICD-10-CM

## 2016-03-19 DIAGNOSIS — R140 Abdominal distension (gaseous): Secondary | ICD-10-CM

## 2016-03-19 LAB — POCT URINALYSIS DIPSTICK
Bilirubin, UA: NEGATIVE
Glucose, UA: NEGATIVE
Ketones, UA: NEGATIVE
Leukocytes, UA: NEGATIVE
Nitrite, UA: NEGATIVE
Protein, UA: NEGATIVE
Spec Grav, UA: 1.01
Urobilinogen, UA: 0.2
pH, UA: 5

## 2016-03-19 NOTE — Patient Instructions (Signed)
Gastroesophageal Reflux Disease, Adult Introduction Normally, food travels down the esophagus and stays in the stomach to be digested. If a person has gastroesophageal reflux disease (GERD), food and stomach acid move back up into the esophagus. When this happens, the esophagus becomes sore and swollen (inflamed). Over time, GERD can make small holes (ulcers) in the lining of the esophagus. Follow these instructions at home: Diet  Follow a diet as told by your doctor. You may need to avoid foods and drinks such as:  Coffee and tea (with or without caffeine).  Drinks that contain alcohol.  Energy drinks and sports drinks.  Carbonated drinks or sodas.  Chocolate and cocoa.  Peppermint and mint flavorings.  Garlic and onions.  Horseradish.  Spicy and acidic foods, such as peppers, chili powder, curry powder, vinegar, hot sauces, and BBQ sauce.  Citrus fruit juices and citrus fruits, such as oranges, lemons, and limes.  Tomato-based foods, such as red sauce, chili, salsa, and pizza with red sauce.  Fried and fatty foods, such as donuts, french fries, potato chips, and high-fat dressings.  High-fat meats, such as hot dogs, rib eye steak, sausage, ham, and bacon.  High-fat dairy items, such as whole milk, butter, and cream cheese.  Eat small meals often. Avoid eating large meals.  Avoid drinking large amounts of liquid with your meals.  Avoid eating meals during the 2-3 hours before bedtime.  Avoid lying down right after you eat.  Do not exercise right after you eat. General instructions  Pay attention to any changes in your symptoms.  Take over-the-counter and prescription medicines only as told by your doctor. Do not take aspirin, ibuprofen, or other NSAIDs unless your doctor says it is okay.  Do not use any tobacco products, including cigarettes, chewing tobacco, and e-cigarettes. If you need help quitting, ask your doctor.  Wear loose clothes. Do not wear anything  tight around your waist.  Raise (elevate) the head of your bed about 6 inches (15 cm).  Try to lower your stress. If you need help doing this, ask your doctor.  If you are overweight, lose an amount of weight that is healthy for you. Ask your doctor about a safe weight loss goal.  Keep all follow-up visits as told by your doctor. This is important. Contact a doctor if:  You have new symptoms.  You lose weight and you do not know why it is happening.  You have trouble swallowing, or it hurts to swallow.  You have wheezing or a cough that keeps happening.  Your symptoms do not get better with treatment.  You have a hoarse voice. Get help right away if:  You have pain in your arms, neck, jaw, teeth, or back.  You feel sweaty, dizzy, or light-headed.  You have chest pain or shortness of breath.  You throw up (vomit) and your throw up looks like blood or coffee grounds.  You pass out (faint).  Your poop (stool) is bloody or black.  You cannot swallow, drink, or eat. This information is not intended to replace advice given to you by your health care provider. Make sure you discuss any questions you have with your health care provider. Document Released: 10/02/2007 Document Revised: 09/21/2015 Document Reviewed: 08/10/2014  2017 [employer]vier  

## 2016-03-19 NOTE — Telephone Encounter (Signed)
Patient was advised. KW 

## 2016-03-19 NOTE — Progress Notes (Signed)
[name] Female    DOB: Jun 29, 1945   70 y.o.   MRN: [id number] Visit Date: 03/19/2016  Today's Provider: Trinna Post, PA-C   Chief Complaint  Patient presents with  . Abdominal Pain   Subjective:    HPI Patient is a 70 y/o female with a history of acid reflux who is here for abdominal pain that has been going on for 3 weeks. She reports it is located in her epigastric region. She has also has nausea but no vomiting, bloating and periods of constipation and diarrhea. She also says she has had some dysuria and has a h/o frequent UTIs. She has doubled up on her Omeprazole to twice daily and that has seemed to help some. She reports that she has had this feeling before but never lasting this long. No weight loss, no fevers. No vaginal bleeding, does have her uterus and ovaries.   Patient reports she has a history of chronic hematuria for which she is followed by Dr. Jacqlyn Larsen at [facility] urology.  Allergies  Allergen Reactions  . Actonel  [Risedronate Sodium]     GI side effects  . Alendronate     GI side effects  . Clarithromycin   . Erythromycin     Jaundice;  CAN TAKE MACROBID PER PATIENT  . Sulfa Antibiotics     Other reaction(s): OTHER     Current Outpatient Prescriptions:  .  Acetaminophen 500 MG coapsule, Take by mouth., Disp: , Rfl:  .  ALPRAZolam (XANAX) 0.5 MG tablet, Take 1 tablet (0.5 mg total) by mouth at bedtime as needed., Disp: 90 tablet, Rfl: 1 .  CALCIUM-VITAMIN D PO, Take by mouth., Disp: , Rfl:  .  Cetirizine HCl (ZYRTEC ALLERGY) 10 MG CAPS, Take by mouth., Disp: , Rfl:  .  omeprazole (PRILOSEC) 20 MG capsule, TAKE 1 TO 2 CAPSULES BY MOUTH EVERY DAY, Disp: 60 capsule, Rfl: 12 .  pravastatin (PRAVACHOL) 40 MG tablet, Take 1 tablet (40 mg total) by mouth daily., Disp: 90 tablet, Rfl: 3 .  zolpidem (AMBIEN) 10 MG tablet, Take 1 tablet (10 mg total) by mouth at bedtime as needed., Disp: 30 tablet, Rfl: 5 .  citalopram (CELEXA) 10 MG tablet, TAKE 1  TABLET(10 MG) BY MOUTH DAILY (Patient not taking: Reported on 03/19/2016), Disp: 90 tablet, Rfl: 1 .  doxycycline (VIBRA-TABS) 100 MG tablet, Take 1 tablet (100 mg total) by mouth 2 (two) times daily. (Patient not taking: Reported on 03/19/2016), Disp: 20 tablet, Rfl: 0 .  fluticasone (FLONASE) 50 MCG/ACT nasal spray, Place into the nose., Disp: , Rfl:  .  meloxicam (MOBIC) 15 MG tablet, TAKE 1 TABLET(15 MG) BY MOUTH DAILY (Patient not taking: Reported on 03/19/2016), Disp: 30 tablet, Rfl: 5 .  montelukast (SINGULAIR) 10 MG tablet, Take 10 mg by mouth at bedtime., Disp: , Rfl:   Review of Systems  Constitutional: Negative.   HENT: Negative.   Eyes: Negative.   Respiratory: Negative.   Gastrointestinal: Positive for abdominal distention, abdominal pain, constipation, diarrhea and nausea.  Endocrine: Negative.   Genitourinary: Positive for dysuria.  Musculoskeletal: Positive for back pain.  Skin: Negative.   Neurological: Negative.   Hematological: Negative.   Psychiatric/Behavioral: Negative.     Social History  Substance Use Topics  . Smoking status: Never Smoker  . Smokeless tobacco: Never Used  . Alcohol use 1.2 - 1.8 oz/week    2 - 3 Glasses of wine per week   Objective:  BP 128/82 (BP Location: Left Arm, Patient Position: Sitting, Cuff Size: Normal)   Pulse 72   Temp 98 F (36.7 C) (Oral)   Resp 16   Wt 125 lb (56.7 kg)   BMI 23.62 kg/m   Physical Exam  Constitutional: She is oriented to person, place, and time. She appears well-developed and well-nourished.  Cardiovascular: Normal rate and regular rhythm.   Pulmonary/Chest: Effort normal and breath sounds normal.  Abdominal: Soft. Bowel sounds are normal. She exhibits no distension and no mass. There is tenderness in the epigastric area. There is no rigidity, no rebound, no guarding, no CVA tenderness, no tenderness at McBurney's point and negative Murphy's sign.  Neurological: She is alert and oriented to person,  place, and time.  Skin: Skin is warm and dry.  Psychiatric: She has a normal mood and affect. Her behavior is normal.        Assessment & Plan:      Problem List Items Addressed This Visit      Genitourinary   Bladder infection, chronic - Primary   Relevant Orders   POCT urinalysis dipstick (Completed)    Other Visit Diagnoses    Gastroesophageal reflux disease, esophagitis presence not specified       Relevant Orders   Ambulatory referral to Gastroenterology   Abdominal bloating       Relevant Orders   Ambulatory referral to Gastroenterology   Lipase     Patient is 70 y/o presenting with epigastric pain and abdominal bloating. Does have known chronic hematuria. She was seen in the office on 02/29/2016 and got yearly labs. CMP was normal. Upon reviewing note, decided to get lipase to assess for pancreatic function. Low suspicion for pancreatitis, but wanted this lab before she went to GI. Did not discuss this with patient in office, will have clinical staff contact her. Patient has upcoming screening colonoscopy in January. Patient taking omeprazole with some relief. Cautioned about long term use at 40mg  dose. Will refer patient to GI for further evaluation, possible EGD. Patient may use OTC Gas x for bloating symptoms.  Return if symptoms worsen or fail to improve.   Patient Instructions  Gastroesophageal Reflux Disease, Adult Introduction Normally, food travels down the esophagus and stays in the stomach to be digested. If a person has gastroesophageal reflux disease (GERD), food and stomach acid move back up into the esophagus. When this happens, the esophagus becomes sore and swollen (inflamed). Over time, GERD can make small holes (ulcers) in the lining of the esophagus. Follow these instructions at home: Diet  Follow a diet as told by your doctor. You may need to avoid foods and drinks such as:  Coffee and tea (with or without caffeine).  Drinks that contain  alcohol.  Energy drinks and sports drinks.  Carbonated drinks or sodas.  Chocolate and cocoa.  Peppermint and mint flavorings.  Garlic and onions.  Horseradish.  Spicy and acidic foods, such as peppers, chili powder, curry powder, vinegar, hot sauces, and BBQ sauce.  Citrus fruit juices and citrus fruits, such as oranges, lemons, and limes.  Tomato-based foods, such as red sauce, chili, salsa, and pizza with red sauce.  Fried and fatty foods, such as donuts, french fries, potato chips, and high-fat dressings.  High-fat meats, such as hot dogs, rib eye steak, sausage, ham, and bacon.  High-fat dairy items, such as whole milk, butter, and cream cheese.  Eat small meals often. Avoid eating large meals.  Avoid drinking large amounts of liquid  with your meals.  Avoid eating meals during the 2-3 hours before bedtime.  Avoid lying down right after you eat.  Do not exercise right after you eat. General instructions  Pay attention to any changes in your symptoms.  Take over-the-counter and prescription medicines only as told by your doctor. Do not take aspirin, ibuprofen, or other NSAIDs unless your doctor says it is okay.  Do not use any tobacco products, including cigarettes, chewing tobacco, and e-cigarettes. If you need help quitting, ask your doctor.  Wear loose clothes. Do not wear anything tight around your waist.  Raise (elevate) the head of your bed about 6 inches (15 cm).  Try to lower your stress. If you need help doing this, ask your doctor.  If you are overweight, lose an amount of weight that is healthy for you. Ask your doctor about a safe weight loss goal.  Keep all follow-up visits as told by your doctor. This is important. Contact a doctor if:  You have new symptoms.  You lose weight and you do not know why it is happening.  You have trouble swallowing, or it hurts to swallow.  You have wheezing or a cough that keeps happening.  Your symptoms do  not get better with treatment.  You have a hoarse voice. Get help right away if:  You have pain in your arms, neck, jaw, teeth, or back.  You feel sweaty, dizzy, or light-headed.  You have chest pain or shortness of breath.  You throw up (vomit) and your throw up looks like blood or coffee grounds.  You pass out (faint).  Your poop (stool) is bloody or black.  You cannot swallow, drink, or eat. This information is not intended to replace advice given to you by your health care provider. Make sure you discuss any questions you have with your health care provider. Document Released: 10/02/2007 Document Revised: 09/21/2015 Document Reviewed: 08/10/2014  2017 Elsevier   The entirety of the information documented in the History of Present Illness, Review of Systems and Physical Exam were personally obtained by me. Portions of this information were initially documented by [place] and reviewed by me for thoroughness and accuracy.            Trinna Post, PA-C  [facility]

## 2016-03-19 NOTE — Telephone Encounter (Signed)
-----   Message from Trinna Post, [place] sent at 03/19/2016  1:37 PM EST ----- I saw this patient in office today for GI symptoms. Can we please call her and tell her I have ordered a lipase to check her pancreas before she goes to GI? She may get this on a walk in basis at [facility] and doesn't need slip. I apologize that I didn't tell her when she was in the office. Thank you.

## 2016-03-21 LAB — LIPASE: Lipase: 52 U/L (ref 14–72)

## 2016-03-25 ENCOUNTER — Telehealth: Payer: Self-pay

## 2016-03-25 NOTE — Telephone Encounter (Signed)
Pt advised.   Thanks,   -Hajira Verhagen  

## 2016-03-25 NOTE — Telephone Encounter (Signed)
-----   Message from Trinna Post, [place] sent at 03/25/2016  1:27 PM EST ----- Lipase was normal. Patient should proceed with her GI referral. Thank you!

## 2016-04-23 ENCOUNTER — Other Ambulatory Visit: Payer: Self-pay | Admitting: Family Medicine

## 2016-04-23 NOTE — Telephone Encounter (Signed)
This is a patient of Dr Alben Spittle Please review  ED

## 2016-04-23 NOTE — Telephone Encounter (Signed)
Called in to [facility] 

## 2016-04-23 NOTE — Telephone Encounter (Signed)
Please call in zolpidem  

## 2016-04-29 DIAGNOSIS — K635 Polyp of colon: Secondary | ICD-10-CM

## 2016-04-29 HISTORY — DX: Polyp of colon: K63.5

## 2016-04-29 HISTORY — PX: COLONOSCOPY: SHX174

## 2016-05-03 DIAGNOSIS — R1310 Dysphagia, unspecified: Secondary | ICD-10-CM | POA: Insufficient documentation

## 2016-05-03 DIAGNOSIS — R140 Abdominal distension (gaseous): Secondary | ICD-10-CM | POA: Insufficient documentation

## 2016-05-03 DIAGNOSIS — R1013 Epigastric pain: Secondary | ICD-10-CM | POA: Insufficient documentation

## 2016-05-03 DIAGNOSIS — K219 Gastro-esophageal reflux disease without esophagitis: Secondary | ICD-10-CM | POA: Insufficient documentation

## 2016-05-31 ENCOUNTER — Other Ambulatory Visit: Payer: Self-pay

## 2016-05-31 NOTE — Telephone Encounter (Signed)
Refill request for Xanax from [facility]

## 2016-06-03 MED ORDER — ALPRAZOLAM 0.5 MG PO TABS: 0.5000 mg | ORAL_TABLET | Freq: Every evening | ORAL | 1 refills | Status: DC | PRN

## 2016-07-11 LAB — HM COLONOSCOPY

## 2016-07-18 ENCOUNTER — Encounter: Payer: Self-pay | Admitting: Family Medicine

## 2016-07-25 ENCOUNTER — Encounter: Payer: Self-pay | Admitting: Family Medicine

## 2016-08-09 ENCOUNTER — Other Ambulatory Visit: Payer: Self-pay | Admitting: Family Medicine

## 2016-08-09 DIAGNOSIS — Z1231 Encounter for screening mammogram for malignant neoplasm of breast: Secondary | ICD-10-CM

## 2016-08-25 ENCOUNTER — Other Ambulatory Visit: Payer: Self-pay | Admitting: Family Medicine

## 2016-08-26 NOTE — Telephone Encounter (Signed)
Please review-aa 

## 2016-08-29 ENCOUNTER — Ambulatory Visit
Admission: RE | Admit: 2016-08-29 | Discharge: 2016-08-29 | Disposition: A | Discharge status: Home / Self Care | Payer: Medicare Other | Source: Ambulatory Visit | Attending: Family Medicine | Admitting: Family Medicine

## 2016-08-29 DIAGNOSIS — Z1231 Encounter for screening mammogram for malignant neoplasm of breast: Secondary | ICD-10-CM | POA: Insufficient documentation

## 2016-09-02 ENCOUNTER — Ambulatory Visit: Payer: Medicare Other | Admitting: Family Medicine

## 2016-10-21 ENCOUNTER — Other Ambulatory Visit: Payer: Self-pay | Admitting: Family Medicine

## 2016-12-18 ENCOUNTER — Ambulatory Visit (INDEPENDENT_AMBULATORY_CARE_PROVIDER_SITE_OTHER): Payer: Medicare Other | Admitting: Podiatry

## 2016-12-18 ENCOUNTER — Encounter: Payer: Self-pay | Admitting: Podiatry

## 2016-12-18 ENCOUNTER — Other Ambulatory Visit: Payer: Self-pay | Admitting: Family Medicine

## 2016-12-18 DIAGNOSIS — M722 Plantar fascial fibromatosis: Secondary | ICD-10-CM | POA: Diagnosis not present

## 2016-12-18 MED ORDER — METHYLPREDNISOLONE 4 MG PO TBPK: ORAL_TABLET | ORAL | 0 refills | Status: DC

## 2016-12-18 NOTE — Progress Notes (Signed)
She presents today chief complaint follow-up of plantar fasciitis bilaterally. She states that they started hurting again in the last couple of months after right black brace and the night boot and I and he hasn't really helped much at this point.  Objective: Vital signs are stable alert and oriented 3. Pulses are palpable. Neurologic sensorium is intact the tendon reflexes are intact muscle strength is intact bilateral she has pain on palpation medial calcaneal tubercles bilateral left greater than right.  Plan: Injected the bilateral heels today started back on anti-inflammatories night splint and plantar fascia braces.

## 2016-12-31 ENCOUNTER — Ambulatory Visit (INDEPENDENT_AMBULATORY_CARE_PROVIDER_SITE_OTHER): Payer: Medicare Other

## 2016-12-31 VITALS — BP 164/78 | HR 72 | Temp 98.1°F | Ht 61.0 in | Wt 131.0 lb

## 2016-12-31 DIAGNOSIS — Z23 Encounter for immunization: Secondary | ICD-10-CM

## 2016-12-31 DIAGNOSIS — Z0000 Encounter for general adult medical examination without abnormal findings: Secondary | ICD-10-CM | POA: Diagnosis not present

## 2016-12-31 NOTE — Progress Notes (Signed)
Subjective:   Sherri Cisneros is a 71 y.o. female who presents for Medicare Annual (Subsequent) preventive examination.  Review of Systems:  N/A  Cardiac Risk Factors include: advanced age (>66men, >22 women);dyslipidemia     Objective:     Vitals: BP (!) 164/78 (BP Location: Left Arm)   Pulse 72   Temp 98.1 F (36.7 C) (Oral)   Ht 5\' 1"  (1.549 m)   Wt 131 lb (59.4 kg)   BMI 24.75 kg/m   Body mass index is 24.75 kg/m.   Tobacco History  Smoking Status  . Never Smoker  Smokeless Tobacco  . Never Used     Counseling given: Not Answered   Past Medical History:  Diagnosis Date  . Plantar fasciitis fall 2016  . Plantar fasciitis    Past Surgical History:  Procedure Laterality Date  . BREAST BIOPSY Right 1998   negative  . HAND SURGERY Right   . LYMPH NODE DISSECTION     removed from back oh neck  . NASAL SINUS SURGERY    . TUBAL LIGATION     Family History  Problem Relation Age of Onset  . Hypertension Mother   . CVA Mother   . Thyroid disease Mother   . Seizures Mother   . Stroke Father   . Heart attack Father   . Alcohol abuse Sister   . Breast cancer Maternal Grandmother   . Heart attack Paternal Grandmother   . CVA Paternal Grandmother   . CVA Paternal Grandfather   . Heart attack Paternal Grandfather    History  Sexual Activity  . Sexual activity: Not on file    Outpatient Encounter Prescriptions as of 12/31/2016  Medication Sig  . Acetaminophen 500 MG coapsule Take by mouth.  . ALPRAZolam (XANAX) 0.5 MG tablet TAKE 1 TABLET BY MOUTH EVERY NIGHT AT BEDTIME AS NEEDED  . CALCIUM-VITAMIN D PO Take by mouth.  Marland Kitchen omeprazole (PRILOSEC) 20 MG capsule TAKE 1 TO 2 CAPSULES BY MOUTH EVERY DAY  . pravastatin (PRAVACHOL) 40 MG tablet Take 1 tablet (40 mg total) by mouth daily.  Marland Kitchen zolpidem (AMBIEN) 10 MG tablet TAKE 1 TABLET BY MOUTH EVERY DAY AT BEDTIME AS NEEDED (Patient taking differently: TAKE 0.5 to 1 TABLET BY MOUTH EVERY DAY AT BEDTIME AS NEEDED)    . Cetirizine HCl (ZYRTEC ALLERGY) 10 MG CAPS Take by mouth.   . fluticasone (FLONASE) 50 MCG/ACT nasal spray Place into the nose.  . montelukast (SINGULAIR) 10 MG tablet Take 10 mg by mouth at bedtime.  . [DISCONTINUED] citalopram (CELEXA) 10 MG tablet TAKE 1 TABLET(10 MG) BY MOUTH DAILY (Patient not taking: Reported on 03/19/2016)  . [DISCONTINUED] methylPREDNISolone (MEDROL DOSEPAK) 4 MG TBPK tablet 6 day dose pack - take as directed   No facility-administered encounter medications on file as of 12/31/2016.     Activities of Daily Living In your present state of health, do you have any difficulty performing the following activities: 12/31/2016 02/29/2016  Hearing? N N  Vision? N Y  Difficulty concentrating or making decisions? N N  Walking or climbing stairs? Y N  Dressing or bathing? N N  Doing errands, shopping? N N  Preparing Food and eating ? N N  Using the Toilet? N N  In the past six months, have you accidently leaked urine? Y N  Comment occasionally wears protection when going out -  Do you have problems with loss of bowel control? N N  Managing your Medications? N N  Managing your Finances? N N  Housekeeping or managing your Housekeeping? N N  Some recent data might be hidden    Patient Care Team: Jerrol Banana., MD as PCP - General (Family Medicine) Arelia Sneddon, OD as Consulting Physician (Optometry) [place], Romilda Garret, DPM as Consulting Physician (Podiatry) Jannet Mantis, MD as Consulting Physician (Dermatology)    Assessment:     Exercise Activities and Dietary recommendations Current Exercise Habits: The patient does not participate in regular exercise at present, Exercise limited by: Other - see comments (muscle pains in the last 1 month)  Goals    . Increase water intake          Starting 02/29/16, I will increase my water intake to 6 glasses a day.      Fall Risk Fall Risk  12/31/2016 02/29/2016 01/04/2015  Falls in the past year? No No Yes   Number falls in past yr: - - 1  Injury with Fall? - - Yes   Depression Screen PHQ 2/9 Scores 12/31/2016 02/29/2016 01/04/2015  PHQ - 2 Score 1 1 1      Cognitive Function- declined screening today     6CIT Screen 02/29/2016  What Year? 0 points  What month? 0 points  What time? 0 points  Count back from 20 0 points  Months in reverse 0 points  Repeat phrase 0 points  Total Score 0    Immunization History  Administered Date(s) Administered  . Influenza, High Dose Seasonal PF 01/04/2015, 02/29/2016, 12/31/2016  . Pneumococcal Conjugate-13 01/04/2015  . Pneumococcal Polysaccharide-23 01/21/2012  . Td 08/17/2003, 12/21/2010  . Tdap 07/02/2011, 03/07/2014  . Zoster 08/12/2011   Screening Tests Health Maintenance  Topic Date Due  . INFLUENZA VACCINE  11/27/2016  . MAMMOGRAM  08/30/2018  . COLONOSCOPY  07/11/2021  . TETANUS/TDAP  03/07/2024  . DEXA SCAN  Completed  . Hepatitis C Screening  Completed  . PNA vac Low Risk Adult  Completed      Plan:  I have personally reviewed and addressed the Medicare Annual Wellness questionnaire and have noted the following in the patient's chart:  A. Medical and social history B. Use of alcohol, tobacco or illicit drugs  C. Current medications and supplements D. Functional ability and status E.  Nutritional status F.  Physical activity G. Advance directives H. List of other physicians I.  Hospitalizations, surgeries, and ER visits in previous 12 months J.  [place] such as hearing and vision if needed, cognitive and depression L. Referrals and appointments - none  In addition, I have reviewed and discussed with patient certain preventive protocols, quality metrics, and best practice recommendations. A written personalized care plan for preventive services as well as general preventive health recommendations were provided to patient.  See attached scanned questionnaire for additional information.   Signed,  Fabio Neighbors, LPN Nurse Health Advisor   MD Recommendations: None.

## 2016-12-31 NOTE — Patient Instructions (Signed)
Sherri Cisneros , Thank you for taking time to come for your Medicare Wellness Visit. I appreciate your ongoing commitment to your health goals. Please review the following plan we discussed and let me know if I can assist you in the future.   Screening recommendations/referrals: Colonoscopy: up to date Mammogram: up to date Bone Density: up to date Recommended yearly ophthalmology/optometry visit for glaucoma screening and checkup Recommended yearly dental visit for hygiene and checkup  Vaccinations: Influenza vaccine: completed today Pneumococcal vaccine: completed series Tdap vaccine: up to date Shingles vaccine: completed 08/12/11  Advanced directives: Please bring a copy of your POA (Power of [place]) and/or Living Will to your next appointment.   Conditions/risks identified: Recommend to increase water intake to 6-8 glasses a day.  Next appointment: 01/02/17 @ 9:00 AM   Preventive Care 65 Years and Older, Female Preventive care refers to lifestyle choices and visits with your health care provider that can promote health and wellness. What does preventive care include?  A yearly physical exam. This is also called an annual well check.  Dental exams once or twice a year.  Routine eye exams. Ask your health care provider how often you should have your eyes checked.  Personal lifestyle choices, including:  Daily care of your teeth and gums.  Regular physical activity.  Eating a healthy diet.  Avoiding tobacco and drug use.  Limiting alcohol use.  Practicing safe sex.  Taking low-dose aspirin every day.  Taking vitamin and mineral supplements as recommended by your health care provider. What happens during an annual well check? The services and screenings done by your health care provider during your annual well check will depend on your age, overall health, lifestyle risk factors, and family history of disease. Counseling  Your health care provider may ask you  questions about your:  Alcohol use.  Tobacco use.  Drug use.  Emotional well-being.  Home and relationship well-being.  Sexual activity.  Eating habits.  History of falls.  Memory and ability to understand (cognition).  Work and work Statistician.  Reproductive health. Screening  You may have the following tests or measurements:  Height, weight, and BMI.  Blood pressure.  Lipid and cholesterol levels. These may be checked every 5 years, or more frequently if you are over 80 years old.  Skin check.  Lung cancer screening. You may have this screening every year starting at age 37 if you have a 30-pack-year history of smoking and currently smoke or have quit within the past 15 years.  Fecal occult blood test (FOBT) of the stool. You may have this test every year starting at age 52.  Flexible sigmoidoscopy or colonoscopy. You may have a sigmoidoscopy every 5 years or a colonoscopy every 10 years starting at age 52.  Hepatitis C blood test.  Hepatitis B blood test.  Sexually transmitted disease (STD) testing.  Diabetes screening. This is done by checking your blood sugar (glucose) after you have not eaten for a while (fasting). You may have this done every 1-3 years.  Bone density scan. This is done to screen for osteoporosis. You may have this done starting at age 20.  Mammogram. This may be done every 1-2 years. Talk to your health care provider about how often you should have regular mammograms. Talk with your health care provider about your test results, treatment options, and if necessary, the need for more tests. Vaccines  Your health care provider may recommend certain vaccines, such as:  Influenza vaccine. This is  recommended every year.  Tetanus, diphtheria, and acellular pertussis (Tdap, Td) vaccine. You may need a Td booster every 10 years.  Zoster vaccine. You may need this after age 42.  Pneumococcal 13-valent conjugate (PCV13) vaccine. One dose is  recommended after age 42.  Pneumococcal polysaccharide (PPSV23) vaccine. One dose is recommended after age 57. Talk to your health care provider about which screenings and vaccines you need and how often you need them. This information is not intended to replace advice given to you by your health care provider. Make sure you discuss any questions you have with your health care provider. Document Released: 05/12/2015 Document Revised: 01/03/2016 Document Reviewed: 02/14/2015 Elsevier Interactive Patient Education  2017 [place] Prevention in the Home Falls can cause injuries. They can happen to people of all ages. There are many things you can do to make your home safe and to help prevent falls. What can I do on the outside of my home?  Regularly fix the edges of walkways and driveways and fix any cracks.  Remove anything that might make you trip as you walk through a door, such as a raised step or threshold.  Trim any bushes or trees on the path to your home.  Use bright outdoor lighting.  Clear any walking paths of anything that might make someone trip, such as rocks or tools.  Regularly check to see if handrails are loose or broken. Make sure that both sides of any steps have handrails.  Any raised decks and porches should have guardrails on the edges.  Have any leaves, snow, or ice cleared regularly.  Use sand or salt on walking paths during winter.  Clean up any spills in your garage right away. This includes oil or grease spills. What can I do in the bathroom?  Use night lights.  Install grab bars by the toilet and in the tub and shower. Do not use towel bars as grab bars.  Use non-skid mats or decals in the tub or shower.  If you need to sit down in the shower, use a plastic, non-slip stool.  Keep the floor dry. Clean up any water that spills on the floor as soon as it happens.  Remove soap buildup in the tub or shower regularly.  Attach bath mats  securely with double-sided non-slip rug tape.  Do not have throw rugs and other things on the floor that can make you trip. What can I do in the bedroom?  Use night lights.  Make sure that you have a light by your bed that is easy to reach.  Do not use any sheets or blankets that are too big for your bed. They should not hang down onto the floor.  Have a firm chair that has side arms. You can use this for support while you get dressed.  Do not have throw rugs and other things on the floor that can make you trip. What can I do in the kitchen?  Clean up any spills right away.  Avoid walking on wet floors.  Keep items that you use a lot in easy-to-reach places.  If you need to reach something above you, use a strong step stool that has a grab bar.  Keep electrical cords out of the way.  Do not use floor polish or wax that makes floors slippery. If you must use wax, use non-skid floor wax.  Do not have throw rugs and other things on the floor that can make you trip.  What can I do with my stairs?  Do not leave any items on the stairs.  Make sure that there are handrails on both sides of the stairs and use them. Fix handrails that are broken or loose. Make sure that handrails are as long as the stairways.  Check any carpeting to make sure that it is firmly attached to the stairs. Fix any carpet that is loose or worn.  Avoid having throw rugs at the top or bottom of the stairs. If you do have throw rugs, attach them to the floor with carpet tape.  Make sure that you have a light switch at the top of the stairs and the bottom of the stairs. If you do not have them, ask someone to add them for you. What else can I do to help prevent falls?  Wear shoes that:  Do not have high heels.  Have rubber bottoms.  Are comfortable and fit you well.  Are closed at the toe. Do not wear sandals.  If you use a stepladder:  Make sure that it is fully opened. Do not climb a closed  stepladder.  Make sure that both sides of the stepladder are locked into place.  Ask someone to hold it for you, if possible.  Clearly mark and make sure that you can see:  Any grab bars or handrails.  First and last steps.  Where the edge of each step is.  Use tools that help you move around (mobility aids) if they are needed. These include:  Canes.  Walkers.  Scooters.  Crutches.  Turn on the lights when you go into a dark area. Replace any light bulbs as soon as they burn out.  Set up your furniture so you have a clear path. Avoid moving your furniture around.  If any of your floors are uneven, fix them.  If there are any pets around you, be aware of where they are.  Review your medicines with your doctor. Some medicines can make you feel dizzy. This can increase your chance of falling. Ask your doctor what other things that you can do to help prevent falls. This information is not intended to replace advice given to you by your health care provider. Make sure you discuss any questions you have with your health care provider. Document Released: 02/09/2009 Document Revised: 09/21/2015 Document Reviewed: 05/20/2014 Elsevier Interactive Patient Education  2017 [employer].

## 2017-01-02 ENCOUNTER — Ambulatory Visit (INDEPENDENT_AMBULATORY_CARE_PROVIDER_SITE_OTHER): Payer: Medicare Other | Admitting: Family Medicine

## 2017-01-02 VITALS — BP 140/78 | HR 68 | Temp 98.0°F | Resp 16 | Ht 62.0 in | Wt 129.0 lb

## 2017-01-02 DIAGNOSIS — Z0000 Encounter for general adult medical examination without abnormal findings: Secondary | ICD-10-CM

## 2017-01-02 DIAGNOSIS — R319 Hematuria, unspecified: Secondary | ICD-10-CM

## 2017-01-02 LAB — POCT URINALYSIS DIPSTICK
Bilirubin, UA: NEGATIVE
GLUCOSE UA: NEGATIVE
Ketones, UA: NEGATIVE
LEUKOCYTES UA: NEGATIVE
NITRITE UA: NEGATIVE
PROTEIN UA: NEGATIVE
Spec Grav, UA: 1.015 (ref 1.010–1.025)
UROBILINOGEN UA: NEGATIVE U/dL — AB
pH, UA: 6 (ref 5.0–8.0)

## 2017-01-02 NOTE — Progress Notes (Signed)
[name], Female    DOB: [birth year], 71 y.o.   MRN: [id number] Visit Date: 01/02/2017  Today's Provider: Wilhemena Durie, MD   Chief Complaint  Patient presents with  . Annual Exam   Subjective:   Sherri Cisneros is a 71 y.o. female who presents today for her Subsequent Annual Wellness Visit. She feels fairly well. She reports exercising daily. She reports she is sleeping poorly.  Immunization History  Administered Date(s) Administered  . Influenza, High Dose Seasonal PF 01/04/2015, 02/29/2016, 12/31/2016  . Pneumococcal Conjugate-13 01/04/2015  . Pneumococcal Polysaccharide-23 01/21/2012  . Td 08/17/2003, 12/21/2010  . Tdap 07/02/2011, 03/07/2014  . Zoster 08/12/2011   07/01/16 Colonoscopy, Elliott-hemorrhoids, sessile serrated adenoma, colonic mucosa with focal glandular hyperplasia, repeat 5 years.  08/29/16 Mammogram 02/29/16 Pap-normal 07/04/11 BMD-osteopenia  Review of Systems  Constitutional: Positive for activity change and fatigue.  HENT: Negative.   Eyes: Positive for photophobia.  Respiratory: Negative.   Cardiovascular: Positive for leg swelling.  Gastrointestinal: Negative.   Endocrine: Negative.   Genitourinary: Positive for hematuria (history of hematuria).  Musculoskeletal: Positive for arthralgias, back pain, myalgias and neck pain.  Skin: Negative.   Allergic/Immunologic: Positive for environmental allergies and food allergies.  Neurological: Positive for numbness and headaches.  Hematological: Negative.   Psychiatric/Behavioral: Positive for sleep disturbance.    Patient Active Problem List   Diagnosis Date Noted  . Insomnia 02/22/2015  . Absolute anemia 10/24/2014  . Anxiety 10/24/2014  . Cervical muscle strain 10/24/2014  . Clinical depression 10/24/2014  . E-coli UTI 10/24/2014  . Elevated TSH 10/24/2014  . Dysfunction of eustachian tube 10/24/2014  . Blood in the urine 10/24/2014  . Hypercholesteremia 10/24/2014  . Major depressive  disorder, single episode 10/24/2014  . Migraine 10/24/2014  . Muscle ache 10/24/2014  . Nephritis 10/24/2014  . FOM (frequency of micturition) 10/24/2014  . Head revolving around 10/24/2014  . Raynaud's syndrome 10/24/2014  . Arthritis, degenerative 10/24/2014  . Atypical nevus 10/24/2014  . Bladder infection, chronic 06/01/2012    Social History   Social History  . Marital status: Married    Spouse name: N/A  . Number of children: 1  . Years of education: N/A   Occupational History  . Not on file.   Social History Main Topics  . Smoking status: Never Smoker  . Smokeless tobacco: Never Used  . Alcohol use 0.0 - 0.6 oz/week  . Drug use: No  . Sexual activity: Not on file   Other Topics Concern  . Not on file   Social History Narrative  . No narrative on file    Past Surgical History:  Procedure Laterality Date  . BREAST BIOPSY Right 1998   negative  . HAND SURGERY Right   . LYMPH NODE DISSECTION     removed from back oh neck  . NASAL SINUS SURGERY    . TUBAL LIGATION      Her family history includes Alcohol abuse in her sister; Breast cancer in her maternal grandmother; CVA in her mother, paternal grandfather, and paternal grandmother; Heart attack in her father, paternal grandfather, and paternal grandmother; Hypertension in her mother; Seizures in her mother; Stroke in her father; Thyroid disease in her mother.     Outpatient Encounter Prescriptions as of 01/02/2017  Medication Sig Note  . Acetaminophen 500 MG coapsule Take by mouth. 10/22/2014: Medication taken as needed.  Received from: [facility]  . ALPRAZolam (XANAX) 0.5 MG tablet TAKE 1 TABLET BY MOUTH EVERY  NIGHT AT BEDTIME AS NEEDED   . CALCIUM-VITAMIN D PO Take by mouth. 10/22/2014: Received from: [facility]  . Cetirizine HCl (ZYRTEC ALLERGY) 10 MG CAPS Take by mouth.  10/22/2014: Received from: [facility]  . fluticasone (FLONASE) 50 MCG/ACT nasal  spray Place into the nose. 10/22/2014: Received from: [facility]  . montelukast (SINGULAIR) 10 MG tablet Take 10 mg by mouth at bedtime.   Marland Kitchen omeprazole (PRILOSEC) 20 MG capsule TAKE 1 TO 2 CAPSULES BY MOUTH EVERY DAY   . pravastatin (PRAVACHOL) 40 MG tablet Take 1 tablet (40 mg total) by mouth daily.   Marland Kitchen zolpidem (AMBIEN) 10 MG tablet TAKE 1 TABLET BY MOUTH EVERY DAY AT BEDTIME AS NEEDED (Patient taking differently: TAKE 0.5 to 1 TABLET BY MOUTH EVERY DAY AT BEDTIME AS NEEDED)    No facility-administered encounter medications on file as of 01/02/2017.     Allergies  Allergen Reactions  . Actonel  [Risedronate Sodium]     GI side effects  . Alendronate     GI side effects  . Clarithromycin   . Erythromycin     Jaundice;  CAN TAKE MACROBID PER PATIENT  . Sulfa Antibiotics     Other reaction(s): OTHER    Patient Care Team: Jerrol Banana., MD as PCP - General (Family Medicine) Arelia Sneddon, OD as Consulting Physician (Optometry) [place], Romilda Garret, DPM as Consulting Physician (Podiatry) Jannet Mantis, MD as Consulting Physician (Dermatology)   Objective:   Vitals:  Vitals:   01/02/17 0901  BP: 140/78  Pulse: 68  Resp: 16  Temp: 98 F (36.7 C)  TempSrc: Oral  Weight: 129 lb (58.5 kg)  Height: 5\' 2"  (1.575 m)    Physical Exam  Constitutional: She is oriented to person, place, and time. She appears well-developed and well-nourished.  HENT:  Head: Normocephalic and atraumatic.  Right Ear: External ear normal.  Left Ear: External ear normal.  Nose: Nose normal.  Mouth/Throat: Oropharynx is clear and moist.  Eyes: Pupils are equal, round, and reactive to light. Conjunctivae and EOM are normal.  Neck: Normal range of motion. Neck supple.  Cardiovascular: Normal rate, regular rhythm, normal heart sounds and intact distal pulses.   Pulmonary/Chest: Effort normal and breath sounds normal.  Abdominal: Soft. Bowel sounds are normal.   Musculoskeletal: Normal range of motion.  Neurological: She is alert and oriented to person, place, and time.  Skin: Skin is warm and dry.  Psychiatric: She has a normal mood and affect. Her behavior is normal. Judgment and thought content normal.    Activities of Daily Living In your present state of health, do you have any difficulty performing the following activities: 12/31/2016 02/29/2016  Hearing? N N  Vision? N Y  Difficulty concentrating or making decisions? N N  Walking or climbing stairs? Y N  Dressing or bathing? N N  Doing errands, shopping? N N  Preparing Food and eating ? N N  Using the Toilet? N N  In the past six months, have you accidently leaked urine? Y N  Comment occasionally wears protection when going out -  Do you have problems with loss of bowel control? N N  Managing your Medications? N N  Managing your Finances? N N  Housekeeping or managing your Housekeeping? N N  Some recent data might be hidden    Fall Risk Assessment Fall Risk  12/31/2016 02/29/2016 01/04/2015  Falls in the past year? No No Yes  Number falls in  past yr: - - 1  Injury with Fall? - - Yes     Depression Screen PHQ 2/9 Scores 12/31/2016 02/29/2016 01/04/2015  PHQ - 2 Score 1 1 1        Assessment & Plan:     Annual Wellness Visit  Reviewed patient's Family Medical History Reviewed and updated list of patient's medical providers Assessment of cognitive impairment was done Assessed patient's functional ability Established a written schedule for health screening [place] Completed and Reviewed  Exercise Activities and Dietary recommendations Goals    . Increase water intake          Starting 02/29/16, I will increase my water intake to 6 glasses a day.       Immunization History  Administered Date(s) Administered  . Influenza, High Dose Seasonal PF 01/04/2015, 02/29/2016, 12/31/2016  . Pneumococcal Conjugate-13 01/04/2015  . Pneumococcal  Polysaccharide-23 01/21/2012  . Td 08/17/2003, 12/21/2010  . Tdap 07/02/2011, 03/07/2014  . Zoster 08/12/2011    Health Maintenance  Topic Date Due  . MAMMOGRAM  08/30/2018  . COLONOSCOPY  07/11/2021  . TETANUS/TDAP  03/07/2024  . INFLUENZA VACCINE  Completed  . DEXA SCAN  Completed  . Hepatitis C Screening  Completed  . PNA vac Low Risk Adult  Completed    Colonoscopy 3/18 Discussed health benefits of physical activity, and encouraged her to engage in regular exercise appropriate for her age and condition.  Myalgia Check CK and stop Pravachol. HLD Plantar Fasciiitis  I have done the exam and reviewed the chart and it is accurate to the best of my knowledge. Development worker, community has been used and  any errors in dictation or transcription are unintentional. Miguel Aschoff M.D. [facility]

## 2017-01-02 NOTE — Patient Instructions (Signed)
Discontinue Pravastatin to see if it helps improve the muscle aches.   Alternate Tylenol and Ibuprofen for muscle pain. Follow up in 2 months

## 2017-01-15 ENCOUNTER — Ambulatory Visit (INDEPENDENT_AMBULATORY_CARE_PROVIDER_SITE_OTHER): Payer: Medicare Other | Admitting: Podiatry

## 2017-01-15 DIAGNOSIS — M722 Plantar fascial fibromatosis: Secondary | ICD-10-CM | POA: Diagnosis not present

## 2017-01-16 NOTE — Progress Notes (Signed)
She presented today for follow-up of plantar fasciitis bilaterally. She states that there is still hurting me in fact my left heel has become worse than it was previously.  Objective: Vital signs are stable alert and oriented 3. Pulses are palpable. Neurologic sensorium is intact. Deep tendon reflexes are intact still has pain on palpation medial calcaneal tubercles bilaterally.  Assessment: Plantar fasciitis bilateral.  Plan: Reinjected the bilateral heels today and we dispensed L 1902. Follow-up with me in 1 month if necessary. May need to consider orthotics.

## 2017-02-20 ENCOUNTER — Ambulatory Visit (INDEPENDENT_AMBULATORY_CARE_PROVIDER_SITE_OTHER): Payer: Medicare Other | Admitting: Physician Assistant

## 2017-02-20 ENCOUNTER — Encounter: Payer: Self-pay | Admitting: Physician Assistant

## 2017-02-20 ENCOUNTER — Other Ambulatory Visit: Payer: Self-pay

## 2017-02-20 VITALS — BP 116/76 | HR 72 | Temp 98.5°F | Resp 16 | Wt 128.0 lb

## 2017-02-20 DIAGNOSIS — D3610 Benign neoplasm of peripheral nerves and autonomic nervous system, unspecified: Secondary | ICD-10-CM | POA: Diagnosis not present

## 2017-02-20 DIAGNOSIS — H9201 Otalgia, right ear: Secondary | ICD-10-CM

## 2017-02-20 DIAGNOSIS — R51 Headache: Secondary | ICD-10-CM

## 2017-02-20 DIAGNOSIS — R519 Headache, unspecified: Secondary | ICD-10-CM

## 2017-02-20 MED ORDER — ZOLPIDEM TARTRATE 10 MG PO TABS: ORAL_TABLET | ORAL | 3 refills | Status: DC

## 2017-02-20 NOTE — Progress Notes (Signed)
[place]  Chief Complaint  Patient presents with  . Sinusitis    Started about three days ago.  . Ear Pain    Right ear.    Subjective:    Patient ID: Sherri Cisneros, female    DOB: 28-Jan-1946, 71 y.o.   MRN: [id number]  Upper Respiratory Infection: Sherri Cisneros is a 71 y.o. female with a past medical history significant for migraine complaining of symptoms of a URI, possible sinusitis. Symptoms include right ear pain, congestion and cough. Onset of symptoms was 3 days ago, gradually worsening since that time. She also c/o achiness, congestion, nasal congestion, non productive cough and post nasal drip for the past 3 days . Has had a headache that has been improving.  She is drinking plenty of fluids. Evaluation to date: none. Treatment to date: cough suppressants and decongestants. The treatment has provided no relief.   Pt also reports a "knot" on her right foot.  She noticed it about two days ago.  Painful, no cuts on surface or injuries.  Review of Systems  Constitutional: Positive for fatigue. Negative for activity change, appetite change, chills, diaphoresis, fever and unexpected weight change.  HENT: Positive for congestion, ear pain (right ear), nosebleeds (Only one day), postnasal drip, rhinorrhea, sinus pain, sinus pressure and sore throat. Negative for ear discharge, sneezing, tinnitus, trouble swallowing and voice change.   Eyes: Negative.   Respiratory: Positive for cough. Negative for apnea, choking, chest tightness, shortness of breath, wheezing and stridor.   Gastrointestinal: Negative.   Musculoskeletal: Positive for neck stiffness. Negative for neck pain.  Neurological: Positive for light-headedness and headaches. Negative for dizziness.       Objective:   BP 116/76 (BP Location: Left Arm, Patient Position: Sitting, Cuff Size: Normal)   Pulse 72   Temp 98.5 F (36.9 C) (Oral)   Patient Active Problem List   Diagnosis  Date Noted  . Insomnia 02/22/2015  . Absolute anemia 10/24/2014  . Anxiety 10/24/2014  . Cervical muscle strain 10/24/2014  . Clinical depression 10/24/2014  . E-coli UTI 10/24/2014  . Elevated TSH 10/24/2014  . Dysfunction of eustachian tube 10/24/2014  . Blood in the urine 10/24/2014  . Hypercholesteremia 10/24/2014  . Major depressive disorder, single episode 10/24/2014  . Migraine 10/24/2014  . Muscle ache 10/24/2014  . Nephritis 10/24/2014  . FOM (frequency of micturition) 10/24/2014  . Head revolving around 10/24/2014  . Raynaud's syndrome 10/24/2014  . Arthritis, degenerative 10/24/2014  . Atypical nevus 10/24/2014  . Bladder infection, chronic 06/01/2012    Outpatient Encounter Prescriptions as of 02/20/2017  Medication Sig Note  . Acetaminophen 500 MG coapsule Take by mouth. 10/22/2014: Medication taken as needed.  Received from: [facility]  . ALPRAZolam (XANAX) 0.5 MG tablet TAKE 1 TABLET BY MOUTH EVERY NIGHT AT BEDTIME AS NEEDED   . CALCIUM-VITAMIN D PO Take by mouth. 10/22/2014: Received from: [facility]  . Cetirizine HCl (ZYRTEC ALLERGY) 10 MG CAPS Take by mouth.  10/22/2014: Received from: [facility]  . montelukast (SINGULAIR) 10 MG tablet Take 10 mg by mouth at bedtime.   Marland Kitchen omeprazole (PRILOSEC) 20 MG capsule TAKE 1 TO 2 CAPSULES BY MOUTH EVERY DAY   . zolpidem (AMBIEN) 10 MG tablet TAKE 1 TABLET BY MOUTH EVERY DAY AT BEDTIME AS NEEDED (Patient taking differently: TAKE 0.5 to 1 TABLET BY MOUTH EVERY DAY AT BEDTIME AS NEEDED)   . fluticasone (FLONASE) 50 MCG/ACT nasal spray Place  into the nose. 10/22/2014: Received from: [facility]  . pravastatin (PRAVACHOL) 40 MG tablet Take 1 tablet (40 mg total) by mouth daily. (Patient not taking: Reported on 02/20/2017)    No facility-administered encounter medications on file as of 02/20/2017.     Allergies  Allergen Reactions  . Actonel  [Risedronate  Sodium]     GI side effects  . Alendronate     GI side effects  . Clarithromycin   . Erythromycin     Jaundice;  CAN TAKE MACROBID PER PATIENT  . Sulfa Antibiotics     Other reaction(s): OTHER       Physical Exam  Constitutional: She is oriented to person, place, and time. She appears well-developed and well-nourished.  HENT:  Right Ear: Tympanic membrane and external ear normal.  Left Ear: Tympanic membrane and external ear normal.  Mouth/Throat: Oropharynx is clear and moist. No oropharyngeal exudate.  Eyes: Conjunctivae are normal.  Neck: Neck supple.  Cardiovascular: Normal rate and regular rhythm.   Pulmonary/Chest: Effort normal and breath sounds normal.  Musculoskeletal:  Small, hard, well circumscribed, mobile lesion on ball of right foot.   Lymphadenopathy:    She has no cervical adenopathy.  Neurological: She is alert and oriented to person, place, and time.  Skin: Skin is warm and dry.  Psychiatric: She has a normal mood and affect. Her behavior is normal.       Assessment & Plan:  1. Right ear pain  Part of viral syndrome. Can do flonase, coricidin, continue Zyrtec.   2. Nonintractable headache, unspecified chronicity pattern, unspecified headache type  Improving.  3. Neuroma  Foot lesion appears to be neuroma. Recommend seeing her podiatrist for this, can possibly inject it.  The entirety of the information documented in the History of Present Illness, Review of Systems and Physical Exam were personally obtained by me. Portions of this information were initially documented by Ashley Royalty, CMA and reviewed by me for thoroughness and accuracy.   Return if symptoms worsen or fail to improve.

## 2017-02-20 NOTE — Patient Instructions (Signed)

## 2017-02-25 ENCOUNTER — Other Ambulatory Visit: Payer: Self-pay | Admitting: Family Medicine

## 2017-03-03 ENCOUNTER — Ambulatory Visit (INDEPENDENT_AMBULATORY_CARE_PROVIDER_SITE_OTHER): Payer: Medicare Other

## 2017-03-03 ENCOUNTER — Ambulatory Visit: Payer: Medicare Other | Admitting: Podiatry

## 2017-03-03 DIAGNOSIS — M7751 Other enthesopathy of right foot: Secondary | ICD-10-CM

## 2017-03-03 DIAGNOSIS — M778 Other enthesopathies, not elsewhere classified: Secondary | ICD-10-CM

## 2017-03-03 DIAGNOSIS — M779 Enthesopathy, unspecified: Secondary | ICD-10-CM

## 2017-03-03 DIAGNOSIS — D3610 Benign neoplasm of peripheral nerves and autonomic nervous system, unspecified: Secondary | ICD-10-CM

## 2017-03-03 NOTE — Progress Notes (Signed)
She presents today with chief complaint of small painful nodule to the plantar aspect of the forefoot right. She states that she's only noticed that there for the past couple of weeks.  Objective: Vital signs are stable alert and oriented 3. Small painful lesion about the size of a BB just in the subcutaneous layer of skin. There is no discoloration erythema cellulitis syndrome.  Assessment: Small neurological fibroma.  Plan: Injected the area today with dexamethasone and local anesthetic and this does not alleviate her symptoms excision is necessary.

## 2017-03-12 ENCOUNTER — Ambulatory Visit: Payer: Self-pay | Admitting: Family Medicine

## 2017-04-04 ENCOUNTER — Other Ambulatory Visit: Payer: Self-pay | Admitting: Family Medicine

## 2017-04-04 NOTE — Telephone Encounter (Signed)
[facility] faxed refill request for the following medications:  ALPRAZolam (XANAX) 0.5 MG tablet   Last Rx: 12/18/16 LOV: 01/02/17 Please advise. Thanks TNP

## 2017-04-08 ENCOUNTER — Ambulatory Visit: Payer: Self-pay | Admitting: Family Medicine

## 2017-04-09 NOTE — Telephone Encounter (Signed)
Pt called saying she is still waiting for a PA from [place]  Her call back is [phone]  Thanks teri

## 2017-04-10 ENCOUNTER — Other Ambulatory Visit: Payer: Self-pay | Admitting: Family Medicine

## 2017-04-11 ENCOUNTER — Other Ambulatory Visit: Payer: Self-pay

## 2017-04-14 ENCOUNTER — Other Ambulatory Visit: Payer: Self-pay | Admitting: Family Medicine

## 2017-04-14 MED ORDER — ALPRAZOLAM 0.5 MG PO TABS: 0.5000 mg | ORAL_TABLET | Freq: Every evening | ORAL | 1 refills | Status: DC | PRN

## 2017-04-15 NOTE — Telephone Encounter (Signed)
RX called in at [facility]  

## 2017-05-01 ENCOUNTER — Other Ambulatory Visit: Payer: Self-pay

## 2017-05-01 ENCOUNTER — Ambulatory Visit: Payer: Medicare Other | Admitting: Family Medicine

## 2017-05-01 VITALS — BP 128/78 | HR 74 | Temp 98.7°F | Resp 16 | Wt 133.0 lb

## 2017-05-01 DIAGNOSIS — M791 Myalgia, unspecified site: Secondary | ICD-10-CM

## 2017-05-01 DIAGNOSIS — E7800 Pure hypercholesterolemia, unspecified: Secondary | ICD-10-CM | POA: Diagnosis not present

## 2017-05-01 DIAGNOSIS — R7989 Other specified abnormal findings of blood chemistry: Secondary | ICD-10-CM | POA: Diagnosis not present

## 2017-05-01 DIAGNOSIS — M722 Plantar fascial fibromatosis: Secondary | ICD-10-CM | POA: Diagnosis not present

## 2017-05-01 DIAGNOSIS — D649 Anemia, unspecified: Secondary | ICD-10-CM | POA: Diagnosis not present

## 2017-05-01 NOTE — Progress Notes (Signed)
Sherri Cisneros  MRN: [id number] DOB: 1946-03-12  Subjective:  HPI   The patient is a 72 year old female who is here for follow up after having her cholesterol medicine discontinued  due to myalgias.  She was last seen for chronic illness on 01/02/17 and at that time she complained of having muscle pain.  She reports that the pain is improved off of the Pravastatin but not completely gone.  She conitnues to complain of fatigue. She also complains today of continued pain with her plantar fasciitis.  She has seen Dr Milinda Pointer and has had injections in the foot but states there is still an area of swelling that causes her pain to the point where she can not walk for long periods of time.   Patient Active Problem List   Diagnosis Date Noted  . Insomnia 02/22/2015  . Absolute anemia 10/24/2014  . Anxiety 10/24/2014  . Cervical muscle strain 10/24/2014  . Clinical depression 10/24/2014  . E-coli UTI 10/24/2014  . Elevated TSH 10/24/2014  . Dysfunction of eustachian tube 10/24/2014  . Blood in the urine 10/24/2014  . Hypercholesteremia 10/24/2014  . Major depressive disorder, single episode 10/24/2014  . Migraine 10/24/2014  . Muscle ache 10/24/2014  . Nephritis 10/24/2014  . FOM (frequency of micturition) 10/24/2014  . Head revolving around 10/24/2014  . Raynaud's syndrome 10/24/2014  . Arthritis, degenerative 10/24/2014  . Atypical nevus 10/24/2014  . Bladder infection, chronic 06/01/2012    Past Medical History:  Diagnosis Date  . Plantar fasciitis fall 2016  . Plantar fasciitis     Social History   Socioeconomic History  . Marital status: Married    Spouse name: Not on file  . Number of children: 1  . Years of education: Not on file  . Highest education level: Not on file  Social Needs  . Financial resource strain: Not on file  . Food insecurity - worry: Not on file  . Food insecurity - inability: Not on file  . Transportation needs - medical: Not on file  . Transportation  needs - non-medical: Not on file  Occupational History  . Not on file  Tobacco Use  . Smoking status: Never Smoker  . Smokeless tobacco: Never Used  Substance and Sexual Activity  . Alcohol use: Yes    Alcohol/week: 0.0 - 0.6 oz  . Drug use: No  . Sexual activity: Not on file  Other Topics Concern  . Not on file  Social History Narrative  . Not on file    Outpatient Encounter Medications as of 05/01/2017  Medication Sig Note  . Acetaminophen 500 MG coapsule Take by mouth. 10/22/2014: Medication taken as needed.  Received from: [facility]  . ALPRAZolam (XANAX) 0.5 MG tablet Take 1 tablet (0.5 mg total) by mouth at bedtime as needed.   Marland Kitchen CALCIUM-VITAMIN D PO Take by mouth. 10/22/2014: Received from: [facility]  . Cetirizine HCl (ZYRTEC ALLERGY) 10 MG CAPS Take by mouth.  10/22/2014: Received from: [facility]  . fluticasone (FLONASE) 50 MCG/ACT nasal spray Place into the nose. 10/22/2014: Received from: [facility]  . omeprazole (PRILOSEC) 20 MG capsule TAKE 1 TO 2 CAPSULES BY MOUTH EVERY DAY   . zolpidem (AMBIEN) 10 MG tablet TAKE 1 TABLET BY MOUTH EVERY NIGHT AT BEDTIME AS NEEDED   . [DISCONTINUED] montelukast (SINGULAIR) 10 MG tablet Take 10 mg by mouth at bedtime.   . [DISCONTINUED] pravastatin (PRAVACHOL) 40 MG tablet Take 1 tablet (  40 mg total) by mouth daily. (Patient not taking: Reported on 02/20/2017)    No facility-administered encounter medications on file as of 05/01/2017.     Allergies  Allergen Reactions  . Actonel  [Risedronate Sodium]     GI side effects  . Alendronate     GI side effects  . Clarithromycin   . Erythromycin     Jaundice;  CAN TAKE MACROBID PER PATIENT  . Sulfa Antibiotics     Other reaction(s): OTHER    Review of Systems  Constitutional: Positive for malaise/fatigue (tires more easily). Negative for fever.  Eyes: Negative.   Respiratory: Negative for cough, shortness of  breath and wheezing.   Cardiovascular: Negative.  Negative for chest pain, palpitations, orthopnea, claudication and leg swelling.  Gastrointestinal: Negative.   Genitourinary: Negative.   Skin: Negative.   Neurological: Positive for weakness.  Endo/Heme/Allergies: Negative.   Psychiatric/Behavioral: The patient has insomnia.        Pt claims chronic insomnia.    Objective:  BP 128/78 (BP Location: Right Arm, Patient Position: Sitting, Cuff Size: Normal)   Pulse 74   Temp 98.7 F (37.1 C) (Oral)   Resp 16   Wt 133 lb (60.3 kg)   BMI 24.33 kg/m   Physical Exam  Constitutional: She is oriented to person, place, and time and well-developed, well-nourished, and in no distress.  HENT:  Head: Normocephalic and atraumatic.  Right Ear: External ear normal.  Left Ear: External ear normal.  Nose: Nose normal.  Eyes: Conjunctivae are normal. Pupils are equal, round, and reactive to light.  Neck: Normal range of motion.  Cardiovascular: Normal rate, regular rhythm and normal heart sounds.  Pulmonary/Chest: Effort normal and breath sounds normal.  Abdominal: Soft.  Neurological: She is alert and oriented to person, place, and time. Gait normal. GCS score is 15.  Skin: Skin is warm and dry.  Psychiatric: Mood, memory, affect and judgment normal.    Assessment and Plan :  1. Hypercholesteremia  - Comprehensive metabolic panel - Lipid Panel With LDL/HDL Ratio  2. Elevated TSH  - TSH  3. Anemia, unspecified type  - CBC with Differential/Platelet  4. Muscle ache  - Comprehensive metabolic panel - Magnesium - CK - Sedimentation rate - ANA  5. Plantar fasciitis  - Ambulatory referral to Orthopedic Surgery 6.Chronic Insomnia Pt advised to try to cut back on ambien with goal of stopping.  I have done the exam and reviewed the chart and it is accurate to the best of my knowledge. Development worker, community has been used and  any errors in dictation or transcription are  unintentional. Miguel Aschoff M.D. [facility]

## 2017-05-02 LAB — COMPREHENSIVE METABOLIC PANEL
A/G RATIO: 1.7 (ref 1.2–2.2)
ALBUMIN: 4.2 g/dL (ref 3.5–4.8)
ALT: 19 IU/L (ref 0–32)
AST: 18 IU/L (ref 0–40)
Alkaline Phosphatase: 48 IU/L (ref 39–117)
BILIRUBIN TOTAL: 0.3 mg/dL (ref 0.0–1.2)
BUN / CREAT RATIO: 26 (ref 12–28)
BUN: 21 mg/dL (ref 8–27)
CHLORIDE: 105 mmol/L (ref 96–106)
CO2: 23 mmol/L (ref 20–29)
Calcium: 9.7 mg/dL (ref 8.7–10.3)
Creatinine, Ser: 0.8 mg/dL (ref 0.57–1.00)
GFR calc non Af Amer: 75 mL/min/{1.73_m2} (ref >59)
GFR, EST AFRICAN AMERICAN: 86 mL/min/{1.73_m2} (ref >59)
Globulin, Total: 2.5 g/dL (ref 1.5–4.5)
Glucose: 94 mg/dL (ref 65–99)
POTASSIUM: 4.6 mmol/L (ref 3.5–5.2)
Sodium: 140 mmol/L (ref 134–144)
TOTAL PROTEIN: 6.7 g/dL (ref 6.0–8.5)

## 2017-05-02 LAB — TSH: TSH: 2.45 u[IU]/mL (ref 0.450–4.500)

## 2017-05-02 LAB — LIPID PANEL WITH LDL/HDL RATIO
Cholesterol, Total: 267 mg/dL — ABNORMAL HIGH (ref 100–199)
HDL: 80 mg/dL (ref >39)
LDL Calculated: 159 mg/dL — ABNORMAL HIGH (ref 0–99)
LDl/HDL Ratio: 2 ratio (ref 0.0–3.2)
Triglycerides: 141 mg/dL (ref 0–149)
VLDL Cholesterol Cal: 28 mg/dL (ref 5–40)

## 2017-05-02 LAB — CBC WITH DIFFERENTIAL/PLATELET
BASOS: 0 %
Basophils Absolute: 0 10*3/uL (ref 0.0–0.2)
EOS (ABSOLUTE): 0.1 10*3/uL (ref 0.0–0.4)
Eos: 1 %
HEMOGLOBIN: 11.3 g/dL (ref 11.1–15.9)
Hematocrit: 35.5 % (ref 34.0–46.6)
IMMATURE GRANS (ABS): 0 10*3/uL (ref 0.0–0.1)
Immature Granulocytes: 0 %
LYMPHS ABS: 2.5 10*3/uL (ref 0.7–3.1)
LYMPHS: 55 %
MCH: 29.5 pg (ref 26.6–33.0)
MCHC: 31.8 g/dL (ref 31.5–35.7)
MCV: 93 fL (ref 79–97)
Monocytes Absolute: 0.4 10*3/uL (ref 0.1–0.9)
Monocytes: 9 %
Neutrophils Absolute: 1.6 10*3/uL (ref 1.4–7.0)
Neutrophils: 35 %
PLATELETS: 232 10*3/uL (ref 150–379)
RBC: 3.83 x10E6/uL (ref 3.77–5.28)
RDW: 16 % — ABNORMAL HIGH (ref 12.3–15.4)
WBC: 4.6 10*3/uL (ref 3.4–10.8)

## 2017-05-02 LAB — CK: Total CK: 116 U/L (ref 24–173)

## 2017-05-02 LAB — SEDIMENTATION RATE: SED RATE: 30 mm/h (ref 0–40)

## 2017-05-02 LAB — MAGNESIUM: MAGNESIUM: 2 mg/dL (ref 1.6–2.3)

## 2017-05-02 LAB — ANA: ANA: NEGATIVE

## 2017-05-05 ENCOUNTER — Telehealth: Payer: Self-pay

## 2017-05-05 NOTE — Telephone Encounter (Signed)
Pt advised.   Thanks,   -Meiah Zamudio  

## 2017-05-05 NOTE — Telephone Encounter (Signed)
-----   Message from Jerrol Banana., MD sent at 05/05/2017  8:22 AM EST ----- Labs OK--cholesterol moderately up.

## 2017-05-07 ENCOUNTER — Ambulatory Visit (INDEPENDENT_AMBULATORY_CARE_PROVIDER_SITE_OTHER): Payer: Medicare Other | Admitting: Orthopedic Surgery

## 2017-05-07 ENCOUNTER — Encounter (INDEPENDENT_AMBULATORY_CARE_PROVIDER_SITE_OTHER): Payer: Self-pay | Admitting: Orthopedic Surgery

## 2017-05-07 ENCOUNTER — Encounter: Payer: Self-pay | Admitting: Family Medicine

## 2017-05-07 DIAGNOSIS — M722 Plantar fascial fibromatosis: Secondary | ICD-10-CM | POA: Insufficient documentation

## 2017-05-07 NOTE — Progress Notes (Signed)
Office Visit Note   [name]           Date of Birth: [birth year]           MRN: [id number] Visit Date: 05/07/2017              Requested by: Jerrol Banana., MD [address] [place], [place] [address] PCP: Jerrol Banana., MD  Chief Complaint  Patient presents with  . Right Foot - Pain  . Left Foot - Pain      HPI: Patient is a 72 year old woman who presents complaining of plantar fasciitis worse on the right than the left.  Patient has undergone excellent treatment by Dr. Milinda Pointer her podiatrist.  She has had injections anti-inflammatories night splints bracing meloxicam and a prednisone Dosepak.  She is also changed her shoe wear with orthotics she is done heel cord stretching.  She still has persistent plantar fascial pain which is worse with start up.  She also complains of some pain in her forefoot.  Assessment & Plan: Visit Diagnoses:  1. Plantar fasciitis, right     Plan: Discussed that she is undergone excellent treatment her options would be to continue with her conservative treatment she could add CBD lotion to try to decrease the inflammation discussed that plantar fascial release is an option but she could still be symptomatic.  Patient would not require a gastrocnemius recession she has excellent dorsiflexion of the ankle.  Follow-Up Instructions: Return if symptoms worsen or fail to improve.   Ortho Exam  Patient is alert, oriented, no adenopathy, well-dressed, normal affect, normal respiratory effort. Examination patient has a normal gait she has good pulses good ankle good subtalar motion she has excellent dorsiflexion of both ankles about 20 degrees past neutral.  She has a little nodule of the fat pad this just appears to be a calcified fat nodule.  She has no plantar fibromatosis nodules.  She has tenderness to palpation over the medial origin of the plantar fascia.  She has a little bit of tenderness to palpation over the  Morton's neuroma lateral compression is not painful but she does have some tenderness to direct palpation.  There is no swelling no cellulitis no dystrophic changes.  Imaging: No results found. No images are attached to the encounter.  Labs: Lab Results  Component Value Date   ESRSEDRATE 30 05/01/2017   ESRSEDRATE 14 01/04/2015    @LABSALLVALUES (HGBA1)@  There is no height or weight on file to calculate BMI.  Orders:  No orders of the defined types were placed in this encounter.  No orders of the defined types were placed in this encounter.    Procedures: No procedures performed  Clinical Data: No additional findings.  ROS:  All other systems negative, except as noted in the HPI. Review of Systems  Objective: Vital Signs: There were no vitals taken for this visit.  Specialty Comments:  No specialty comments available.  PMFS History: Patient Active Problem List   Diagnosis Date Noted  . Plantar fasciitis, right 05/07/2017  . Insomnia 02/22/2015  . Anxiety 10/24/2014  . Cervical muscle strain 10/24/2014  . Elevated TSH 10/24/2014  . Blood in the urine 10/24/2014  . Hypercholesteremia 10/24/2014  . Major depressive disorder, single episode 10/24/2014  . Migraine 10/24/2014  . Muscle ache 10/24/2014  . FOM (frequency of micturition) 10/24/2014  . Raynaud's syndrome 10/24/2014  . Arthritis, degenerative 10/24/2014  . Atypical nevus 10/24/2014   Past Medical  History:  Diagnosis Date  . Plantar fasciitis fall 2016  . Plantar fasciitis     Family History  Problem Relation Age of Onset  . Hypertension Mother   . CVA Mother   . Thyroid disease Mother   . Seizures Mother   . Stroke Father   . Heart attack Father   . Alcohol abuse Sister   . Breast cancer Maternal Grandmother   . Heart attack Paternal Grandmother   . CVA Paternal Grandmother   . CVA Paternal Grandfather   . Heart attack Paternal Grandfather     Past Surgical History:  Procedure  Laterality Date  . BREAST BIOPSY Right 1998   negative  . HAND SURGERY Right   . LYMPH NODE DISSECTION     removed from back oh neck  . NASAL SINUS SURGERY    . TUBAL LIGATION     Social History   Occupational History  . Not on file  Tobacco Use  . Smoking status: Never Smoker  . Smokeless tobacco: Never Used  Substance and Sexual Activity  . Alcohol use: Yes    Alcohol/week: 0.0 - 0.6 oz  . Drug use: No  . Sexual activity: Not on file

## 2017-07-22 ENCOUNTER — Other Ambulatory Visit: Payer: Self-pay | Admitting: Family Medicine

## 2017-07-22 NOTE — Telephone Encounter (Signed)
[facility] faxed a refill request for the following medication. Thanks CC ° °zolpidem (AMBIEN) 10 MG tablet  ° °

## 2017-07-23 MED ORDER — ZOLPIDEM TARTRATE 10 MG PO TABS: 10.0000 mg | ORAL_TABLET | Freq: Every evening | ORAL | 4 refills | Status: DC | PRN

## 2017-08-26 ENCOUNTER — Ambulatory Visit: Payer: Medicare Other | Admitting: Podiatry

## 2017-09-25 ENCOUNTER — Telehealth: Payer: Self-pay | Admitting: Family Medicine

## 2017-09-25 NOTE — Telephone Encounter (Signed)
Spoke with patient she states her husband is having  Surgery and she will schedule later

## 2017-09-29 ENCOUNTER — Ambulatory Visit: Payer: Self-pay | Admitting: Family Medicine

## 2017-10-26 ENCOUNTER — Other Ambulatory Visit: Payer: Self-pay | Admitting: Family Medicine

## 2017-11-07 ENCOUNTER — Other Ambulatory Visit: Payer: Self-pay | Admitting: Podiatry

## 2017-11-07 ENCOUNTER — Ambulatory Visit: Payer: Medicare Other | Admitting: Podiatry

## 2017-11-07 DIAGNOSIS — M722 Plantar fascial fibromatosis: Secondary | ICD-10-CM

## 2017-11-07 MED ORDER — MELOXICAM 15 MG PO TABS: 15.0000 mg | ORAL_TABLET | Freq: Every day | ORAL | 1 refills | Status: DC

## 2017-11-10 NOTE — Progress Notes (Signed)
   Subjective: 72 year old female with PMHx of plantar fasciitis presenting today with a chief complaint of a flare of the right foot that began two months ago. She states the pain is located in the right heel and radiates to the right achilles. She states the pain is worse when she first wakes in the morning or stands from a seated position. She has been icing and stretching the foot as well as wearing the plantar fascial band. Patient is here for further evaluation and treatment.   Past Medical History:  Diagnosis Date  . Plantar fasciitis fall 2016  . Plantar fasciitis      Objective: Physical Exam General: The patient is alert and oriented x3 in no acute distress.  Dermatology: Skin is warm, dry and supple bilateral lower extremities. Negative for open lesions or macerations bilateral.   Vascular: Dorsalis Pedis and Posterior Tibial pulses palpable bilateral.  Capillary fill time is immediate to all digits.  Neurological: Epicritic and protective threshold intact bilateral.   Musculoskeletal: Tenderness to palpation to the plantar aspect of the right heel along the plantar fascia. All other joints range of motion within normal limits bilateral. Strength 5/5 in all groups bilateral.   Assessment: 1. Plantar fasciitis right 2. Pain in right foot  Plan of Care:  1. Patient evaluated. 2. Injection of 0.5cc Celestone soluspan injected into the right plantar fascia  3. Prescription for Meloxicam provided to patient.  4. Continue wearing night splint, plantar fascial brace, good shoe gear and icing area.  5. Return to clinic as needed.    Edrick Kins, DPM [facility]  Dr. Edrick Kins, DPM    [address], [place] [address]                Office [phone]  Fax [phone]

## 2017-11-28 ENCOUNTER — Telehealth: Payer: Self-pay

## 2017-11-28 NOTE — Telephone Encounter (Signed)
Called pt to schedule AWV and pt declined stating that she plans to have this apt completed when she has her CPE done in [place]. Asked pt if I could go ahead and schedule these apts for her and she declined. Pt states she will CB in fall to do so.  -MM

## 2017-12-23 ENCOUNTER — Other Ambulatory Visit: Payer: Self-pay | Admitting: Family Medicine

## 2017-12-23 DIAGNOSIS — Z1231 Encounter for screening mammogram for malignant neoplasm of breast: Secondary | ICD-10-CM

## 2018-01-07 ENCOUNTER — Ambulatory Visit
Admission: RE | Admit: 2018-01-07 | Discharge: 2018-01-07 | Disposition: A | Discharge status: Home / Self Care | Payer: Medicare Other | Source: Ambulatory Visit | Attending: Family Medicine | Admitting: Family Medicine

## 2018-01-07 DIAGNOSIS — Z1231 Encounter for screening mammogram for malignant neoplasm of breast: Secondary | ICD-10-CM | POA: Diagnosis not present

## 2018-01-08 ENCOUNTER — Other Ambulatory Visit: Payer: Self-pay | Admitting: Family Medicine

## 2018-01-08 DIAGNOSIS — R921 Mammographic calcification found on diagnostic imaging of breast: Secondary | ICD-10-CM

## 2018-01-08 DIAGNOSIS — R928 Other abnormal and inconclusive findings on diagnostic imaging of breast: Secondary | ICD-10-CM

## 2018-01-15 ENCOUNTER — Other Ambulatory Visit: Payer: Self-pay | Admitting: Family Medicine

## 2018-01-16 ENCOUNTER — Telehealth: Payer: Self-pay | Admitting: Family Medicine

## 2018-01-16 ENCOUNTER — Ambulatory Visit
Admission: RE | Admit: 2018-01-16 | Discharge: 2018-01-16 | Disposition: A | Discharge status: Home / Self Care | Payer: Medicare Other | Source: Ambulatory Visit | Attending: Family Medicine | Admitting: Family Medicine

## 2018-01-16 DIAGNOSIS — R928 Other abnormal and inconclusive findings on diagnostic imaging of breast: Secondary | ICD-10-CM

## 2018-01-16 DIAGNOSIS — R921 Mammographic calcification found on diagnostic imaging of breast: Secondary | ICD-10-CM | POA: Diagnosis present

## 2018-01-16 NOTE — Telephone Encounter (Signed)
Please review. Thanks!  

## 2018-01-16 NOTE — Telephone Encounter (Signed)
Pt stopped by the office and wanted to let Dr. Rosanna Randy know that she just had her mammogram done and they advised pt she needs to have a biopsy done. Pt wanted Dr. Rosanna Randy to be on the lookout for that information so she can get it done asap. Please advise. Thanks TNP

## 2018-01-19 ENCOUNTER — Other Ambulatory Visit: Payer: Self-pay | Admitting: Family Medicine

## 2018-01-19 ENCOUNTER — Telehealth: Payer: Self-pay | Admitting: Family Medicine

## 2018-01-19 DIAGNOSIS — R921 Mammographic calcification found on diagnostic imaging of breast: Secondary | ICD-10-CM

## 2018-01-19 DIAGNOSIS — R928 Other abnormal and inconclusive findings on diagnostic imaging of breast: Secondary | ICD-10-CM

## 2018-01-19 NOTE — Telephone Encounter (Signed)
Refer to surgery if not already done--thanks

## 2018-01-19 NOTE — Telephone Encounter (Signed)
Pt returned call ° °tp °

## 2018-01-19 NOTE — Telephone Encounter (Signed)
[facility] faxed a refill request for the following medication. Thanks CC  zolpidem (AMBIEN) 10 MG tablet

## 2018-01-19 NOTE — Telephone Encounter (Signed)
LMTCB ED 

## 2018-01-21 ENCOUNTER — Ambulatory Visit
Admission: RE | Admit: 2018-01-21 | Discharge: 2018-01-21 | Disposition: A | Discharge status: Home / Self Care | Payer: Medicare Other | Source: Ambulatory Visit | Attending: Family Medicine | Admitting: Family Medicine

## 2018-01-21 DIAGNOSIS — R921 Mammographic calcification found on diagnostic imaging of breast: Secondary | ICD-10-CM

## 2018-01-21 DIAGNOSIS — R928 Other abnormal and inconclusive findings on diagnostic imaging of breast: Secondary | ICD-10-CM | POA: Insufficient documentation

## 2018-01-21 HISTORY — PX: BREAST BIOPSY: SHX20

## 2018-01-21 NOTE — Telephone Encounter (Signed)
[facility] faxed  SECOND refill request for the following medications:  zolpidem (AMBIEN) 10 MG tablet   Qty: 30  Last refill: 12/16/2017  Please advise.

## 2018-01-22 ENCOUNTER — Other Ambulatory Visit: Payer: Self-pay | Admitting: Family Medicine

## 2018-01-22 ENCOUNTER — Telehealth: Payer: Self-pay

## 2018-01-22 DIAGNOSIS — D0511 Intraductal carcinoma in situ of right breast: Secondary | ICD-10-CM

## 2018-01-22 NOTE — Progress Notes (Signed)
MDT

## 2018-01-22 NOTE — Telephone Encounter (Signed)
[facility] radiology called to report that the Pathology results from breast biopsy reveal DCIS of right breast.  Report is in chart.

## 2018-01-22 NOTE — Telephone Encounter (Signed)
[facility] sent another fax same as below to request this medication again.

## 2018-01-22 NOTE — Telephone Encounter (Signed)
Pt advised--refer to dr James Ivanoff has seen him in past. DCIS on Breast biopsy.

## 2018-01-22 NOTE — Addendum Note (Signed)
Addended by: Althea Charon D on: 01/22/2018 04:17 PM   Modules accepted: Orders

## 2018-01-23 NOTE — Progress Notes (Signed)
  Oncology Nurse Navigator Documentation  Navigator Location: [facility] (01/23/18 1000)   )Navigator Encounter Type: Introductory phone call (01/23/18 1000)   Abnormal Finding Date: 01/16/18 (01/23/18 1000) Confirmed Diagnosis Date: 01/21/18 (01/23/18 1000)               Patient Visit Type: Initial (01/23/18 1000) Treatment Phase: Pre-Tx/Tx Discussion (01/23/18 1000) Barriers/Navigation Needs: Education;Coordination of Care (01/23/18 1000) Education: Newly Diagnosed Cancer Education (01/23/18 1000) Interventions: Education (01/23/18 1000)                      Time Spent with Patient: 30 (01/23/18 1000)  Spoke to patient, and daughter Sherri Cisneros .  Patient volunteered here at [facility] and is known to me.  Introduced to IT trainer.  Given Breast Cancer Treatment Handbook/folder with hospital services.  Patient states she is being referred to Dr. Bary Castilla.  Encouraged to call navigators with any needs.

## 2018-01-24 ENCOUNTER — Ambulatory Visit (INDEPENDENT_AMBULATORY_CARE_PROVIDER_SITE_OTHER): Payer: Medicare Other

## 2018-01-24 DIAGNOSIS — Z23 Encounter for immunization: Secondary | ICD-10-CM

## 2018-01-27 ENCOUNTER — Encounter: Payer: Self-pay | Admitting: General Surgery

## 2018-01-27 ENCOUNTER — Ambulatory Visit: Payer: Self-pay

## 2018-01-27 ENCOUNTER — Ambulatory Visit: Payer: Medicare Other | Admitting: General Surgery

## 2018-01-27 VITALS — BP 178/82 | HR 100 | Ht 62.0 in | Wt 127.0 lb

## 2018-01-27 DIAGNOSIS — D0511 Intraductal carcinoma in situ of right breast: Secondary | ICD-10-CM

## 2018-01-27 DIAGNOSIS — N6314 Unspecified lump in the right breast, lower inner quadrant: Secondary | ICD-10-CM

## 2018-01-27 NOTE — Patient Instructions (Addendum)
The patient is aware to call back for any questions or new concerns.  

## 2018-01-27 NOTE — Progress Notes (Signed)
Patient ID: Sherri Cisneros, female   DOB: [birth year], 72 y.o.   MRN: [id number]  Chief Complaint  Patient presents with  . Breast Problem    HPI Sherri Cisneros is a 72 y.o. female.  who presents for a breast evaluation. The most recent right mammogram and biopsy was done on 01-21-18.  Patient does perform regular self breast checks and gets regular mammograms done.  Denies any breast injury or trauma.  She is here with her husband of 82 years, Zenia Resides, who is recovering rotator cuff surgery.  HPI  Past Medical History:  Diagnosis Date  . Colon polyp 2018  . Plantar fasciitis 2016, 2019   right foot    Past Surgical History:  Procedure Laterality Date  . BREAST BIOPSY Right 01/21/2018   affirm bx ribbon clip DUCTAL CARCINOMA IN SITU, HIGH-GRADE COMEDO TYPE    . BREAST EXCISIONAL BIOPSY Right 1998   negative, Dr Bary Castilla  . COLONOSCOPY  2018   Dr Vira Agar  . HAND SURGERY Right   . LYMPH NODE DISSECTION     removed from back oh neck  . NASAL SINUS SURGERY    . TUBAL LIGATION      Family History  Problem Relation Age of Onset  . Hypertension Mother   . CVA Mother   . Thyroid disease Mother   . Seizures Mother   . Stroke Father   . Heart attack Father   . Alcohol abuse Sister   . Breast cancer Maternal Grandmother   . Heart attack Paternal Grandmother   . CVA Paternal Grandmother   . CVA Paternal Grandfather   . Heart attack Paternal Grandfather   . Colon cancer Neg Hx     Social History Social History   Tobacco Use  . Smoking status: Never Smoker  . Smokeless tobacco: Never Used  Substance Use Topics  . Alcohol use: Yes    Alcohol/week: 0.0 - 1.0 standard drinks  . Drug use: No    Allergies  Allergen Reactions  . Actonel  [Risedronate Sodium]     GI side effects  . Alendronate     GI side effects  . Clarithromycin   . Erythromycin     Jaundice;  CAN TAKE MACROBID PER PATIENT  . Sulfa Antibiotics     Other reaction(s): OTHER    Current Outpatient  Medications  Medication Sig Dispense Refill  . Acetaminophen 500 MG coapsule Take by mouth.    . ALPRAZolam (XANAX) 0.5 MG tablet TAKE 1 TABLET BY MOUTH EVERY NIGHT AT BEDTIME AS NEEDED 90 tablet 0  . CALCIUM-VITAMIN D PO Take by mouth.    . Cetirizine HCl (ZYRTEC ALLERGY) 10 MG CAPS Take by mouth.     Marland Kitchen omeprazole (PRILOSEC) 20 MG capsule TAKE 1 TO 2 CAPSULES BY MOUTH EVERY DAY 180 capsule 0  . zolpidem (AMBIEN) 10 MG tablet TAKE 1 TABLET(10 MG) BY MOUTH AT BEDTIME AS NEEDED 30 tablet 4   No current facility-administered medications for this visit.     Review of Systems Review of Systems  Constitutional: Negative.   Respiratory: Negative.   Cardiovascular: Negative.     Blood pressure (!) 178/82, pulse 100, height 5\' 2"  (1.575 m), weight 127 lb (57.6 kg), SpO2 98 %.  Physical Exam Physical Exam  Constitutional: She is oriented to person, place, and time. She appears well-developed and well-nourished.  HENT:  Mouth/Throat: Oropharynx is clear and moist.  Eyes: Conjunctivae are normal. No scleral icterus.  Neck: Neck supple.  Cardiovascular: Normal rate, regular rhythm and normal heart sounds.  Pulmonary/Chest: Effort normal and breath sounds normal. Right breast exhibits no inverted nipple, no mass, no nipple discharge, no skin change and no tenderness. Left breast exhibits no inverted nipple, no mass, no nipple discharge, no skin change and no tenderness.  Right breast biopsy site.    Lymphadenopathy:    She has no cervical adenopathy.    She has no axillary adenopathy.  Neurological: She is alert and oriented to person, place, and time.  Skin: Skin is warm and dry.  Psychiatric: Her behavior is normal.    Data Reviewed A. BREAST CALCIFICATIONS, RIGHT LOWER INNER; STEREOTACTIC BIOPSY:  - DUCTAL CARCINOMA IN SITU, HIGH-GRADE COMEDO TYPE WITH ASSOCIATED  CALCIFICATIONS.   Comment:  DCIS is present in 2 of 4 blocks with the largest linear focus measuring  3 mm.  ER and  PR will be deferred to an excision specimen.   2018 and 2019 mammograms reviewed.  New foci of microcalcifications in the lower outer quadrant of the right breast.  Ultrasound examination of the right breast was completed to determine if preoperative wire localization would be required.  In the 5 o'clock position 7 cm from the nipple a well-defined biopsy cavity measuring 0.49 x 0.52 x 0.7 cm is identified.  This is approximately 0.78 cm below the skin surface.  BI-RADS-6.   Assessment    High-grade DCIS of the lower inner quadrant of the right breast.    Plan    Patient had spent a fair amount of time on the Internet prior to today's visit reviewing options for management.  The indications for wide excision were discussed.  No indication for sentinel node biopsy.  No anticipated role for adjuvant chemotherapy.  Role of adjuvant hormonal therapy pending receptor status.  Potential for upstaging reviewed.  Indications for radiation therapy discussed.  The patient will be contacted tomorrow to arrange scheduling at a convenient date.      HPI, Physical Exam, Assessment and Plan have been scribed under the direction and in the presence of Robert Bellow, MD. Karie Fetch, RN  I have completed the exam and reviewed the above documentation for accuracy and completeness.  I agree with the above.  Haematologist has been used and any errors in dictation or transcription are unintentional.  Hervey Ard, M.D., F.A.C.S.  Forest Gleason Sabastion Hrdlicka 01/28/2018, 7:37 AM

## 2018-01-28 ENCOUNTER — Other Ambulatory Visit: Payer: Self-pay | Admitting: Family Medicine

## 2018-01-28 ENCOUNTER — Other Ambulatory Visit: Payer: Self-pay | Admitting: General Surgery

## 2018-01-28 ENCOUNTER — Telehealth: Payer: Self-pay | Admitting: *Deleted

## 2018-01-28 DIAGNOSIS — Z86000 Personal history of in-situ neoplasm of breast: Secondary | ICD-10-CM | POA: Insufficient documentation

## 2018-01-28 DIAGNOSIS — D0511 Intraductal carcinoma in situ of right breast: Secondary | ICD-10-CM

## 2018-01-28 NOTE — Telephone Encounter (Signed)
Patient's surgery has been scheduled for 02-06-18 at [facility] with Dr. Bary Castilla.   The patient is aware to call the office should she have further questions. She verbalizes understanding.

## 2018-01-30 ENCOUNTER — Other Ambulatory Visit: Payer: Self-pay

## 2018-01-30 ENCOUNTER — Encounter
Admission: RE | Admit: 2018-01-30 | Discharge: 2018-01-30 | Disposition: A | Discharge status: Home / Self Care | Payer: Medicare Other | Source: Ambulatory Visit | Attending: General Surgery | Admitting: General Surgery

## 2018-01-30 HISTORY — DX: Malignant (primary) neoplasm, unspecified: C80.1

## 2018-01-30 HISTORY — DX: Gastro-esophageal reflux disease without esophagitis: K21.9

## 2018-01-30 HISTORY — DX: Unspecified osteoarthritis, unspecified site: M19.90

## 2018-01-30 HISTORY — DX: Hematuria, unspecified: R31.9

## 2018-01-30 HISTORY — DX: Anxiety disorder, unspecified: F41.9

## 2018-02-02 ENCOUNTER — Telehealth: Payer: Self-pay | Admitting: *Deleted

## 2018-02-02 NOTE — Telephone Encounter (Signed)
Spoke with Anderson Malta and let her know we will try to complete FMLA paperwork this week and call her when ready.

## 2018-02-02 NOTE — Telephone Encounter (Signed)
Pt contacted office for refill request on the following medications:  ALPRAZolam (XANAX) 0.5 MG tablet  [facility] [address]  Last Rx: 10/27/17 LOV: 05/01/17 Please advise. Thanks TNP

## 2018-02-02 NOTE — Telephone Encounter (Signed)
Patient's daughter, Elmon Else, called the office to check and see if her FMLA paperwork had been completed yet.   The patient is presently scheduled for surgery with Dr. Bary Castilla on Friday, 02-06-18.  Daughter is requesting intermittent FMLA to help mom as needed during and after surgery.   Patient's daughter is wanting to see if paperwork can be completed before Friday. Jennifer's cell number is [phone].

## 2018-02-02 NOTE — Patient Instructions (Signed)
Your procedure is scheduled on: 02-06-18 FRIDAY Report to Same Day Surgery 2nd floor medical mall [facility] Entrance-take elevator on left to 2nd floor.  Check in with surgery information desk.) To find out your arrival time please call [phone] between 1PM - 3PM on 02-05-18 THURSDAY  Remember: Instructions that are not followed completely may result in serious medical risk, up to and including death, or upon the discretion of your surgeon and anesthesiologist your surgery may need to be rescheduled.    _x___ 1. Do not eat food after midnight the night before your procedure. NO GUM OR CANDY AFTER MIDNIGHT.  You may drink clear liquids up to 2 hours before you are scheduled to arrive at the hospital for your procedure.  Do not drink clear liquids within 2 hours of your scheduled arrival to the hospital.  Clear liquids include  --Water or Apple juice without pulp  --Clear carbohydrate beverage such as ClearFast or Gatorade  --Black Coffee or Clear Tea (No milk, no creamers, do not add anything to the coffee or Tea   ____Ensure clear carbohydrate drink on the way to the hospital for bariatric patients  ____Ensure clear carbohydrate drink 3 hours before surgery for Dr Dwyane Luo patients if physician instructed.     __x__ 2. No Alcohol for 24 hours before or after surgery.   __x__3. No Smoking or e-cigarettes for 24 prior to surgery.  Do not use any chewable tobacco products for at least 6 hour prior to surgery   ____  4. Bring all medications with you on the day of surgery if instructed.    __x__ 5. Notify your doctor if there is any change in your medical condition     (cold, fever, infections).    x___6. On the morning of surgery brush your teeth with toothpaste and water.  You may rinse your mouth with mouth wash if you wish.  Do not swallow any toothpaste or mouthwash.   Do not wear jewelry, make-up, hairpins, clips or nail polish.  Do not wear lotions, powders, or perfumes.  You may wear deodorant.  Do not shave 48 hours prior to surgery. Men may shave face and neck.  Do not bring valuables to the hospital.    [facility] is not responsible for any belongings or valuables.               Contacts, dentures or bridgework may not be worn into surgery.  Leave your suitcase in the car. After surgery it may be brought to your room.  For patients admitted to the hospital, discharge time is determined by your treatment team.  _  Patients discharged the day of surgery will not be allowed to drive home.  You will need someone to drive you home and stay with you the night of your procedure.    Please read over the following fact sheets that you were given:   [facility] Preparing for Surgery  _x___ TAKE THE FOLLOWING MEDICATION THE MORNING OF SURGERY WITH A SMALL SIP OF WATER. These include:  1. PRILOSEC (OMEPRAZOLE)  2. YOU MAY TAKE XANAX (ALPRAZOLAM) THE DAY OF SURGERY IF NEEDED  3.  4.  5.  6.  ____Fleets enema or Magnesium Citrate as directed.   _x___ Use CHG Soap or sage wipes as directed on instruction sheet   ____ Use inhalers on the day of surgery and bring to hospital day of surgery  ____ Stop Metformin and Janumet 2 days prior to surgery.  ____ Take 1/2 of usual insulin dose the night before surgery and none on the morning surgery.   ____ Follow recommendations from Cardiologist, Pulmonologist or PCP regarding stopping Aspirin, Coumadin, Plavix ,Eliquis, Effient, or Pradaxa, and Pletal.  ____Stop Anti-inflammatories such as Advil, Aleve, Ibuprofen, Motrin, Naproxen, Naprosyn, Goodies powders or aspirin products. OK to take Tylenol    ____ Stop supplements until after surgery.    ____ Bring C-Pap to the hospital.

## 2018-02-03 ENCOUNTER — Encounter: Payer: Self-pay | Admitting: General Surgery

## 2018-02-03 ENCOUNTER — Ambulatory Visit (INDEPENDENT_AMBULATORY_CARE_PROVIDER_SITE_OTHER): Payer: Medicare Other | Admitting: General Surgery

## 2018-02-03 ENCOUNTER — Encounter
Admission: RE | Admit: 2018-02-03 | Discharge: 2018-02-03 | Disposition: A | Discharge status: Home / Self Care | Payer: Medicare Other | Source: Ambulatory Visit | Attending: General Surgery | Admitting: General Surgery

## 2018-02-03 VITALS — BP 150/80 | HR 84 | Resp 12 | Ht 62.0 in | Wt 129.0 lb

## 2018-02-03 DIAGNOSIS — R9431 Abnormal electrocardiogram [ECG] [EKG]: Secondary | ICD-10-CM | POA: Diagnosis not present

## 2018-02-03 DIAGNOSIS — Z01810 Encounter for preprocedural cardiovascular examination: Secondary | ICD-10-CM | POA: Diagnosis not present

## 2018-02-03 DIAGNOSIS — D0511 Intraductal carcinoma in situ of right breast: Secondary | ICD-10-CM

## 2018-02-03 NOTE — Progress Notes (Signed)
Patient ID: Sherri Cisneros, female   DOB: 1946-03-03, 72 y.o.   MRN: [id number]  Chief Complaint  Patient presents with  . Other    HPI Sherri Cisneros is a 72 y.o. female here today for a evaluation of right breast lump at biopsy site.  . Patient is having awide excision on 02/06/2018.  HPI  Past Medical History:  Diagnosis Date  . Anxiety   . Arthritis   . Cancer ([place])    DCIS  . Colon polyp 2018  . GERD (gastroesophageal reflux disease)   . Hematuria   . Plantar fasciitis 2016, 2019   right foot    Past Surgical History:  Procedure Laterality Date  . BREAST BIOPSY Right 01/21/2018   affirm bx ribbon clip DUCTAL CARCINOMA IN SITU, HIGH-GRADE COMEDO TYPE    . BREAST EXCISIONAL BIOPSY Right 1998   negative, Dr Bary Castilla  . COLONOSCOPY  2018   Dr Vira Agar  . HAND SURGERY Right   . LYMPH NODE DISSECTION     removed from back oh neck  . NASAL SINUS SURGERY    . TUBAL LIGATION      Family History  Problem Relation Age of Onset  . Hypertension Mother   . CVA Mother   . Thyroid disease Mother   . Seizures Mother   . Stroke Father   . Heart attack Father   . Alcohol abuse Sister   . Breast cancer Maternal Grandmother   . Heart attack Paternal Grandmother   . CVA Paternal Grandmother   . CVA Paternal Grandfather   . Heart attack Paternal Grandfather   . Colon cancer Neg Hx     Social History Social History   Tobacco Use  . Smoking status: Never Smoker  . Smokeless tobacco: Never Used  Substance Use Topics  . Alcohol use: Yes    Alcohol/week: 0.0 - 1.0 standard drinks    Comment: OCC WINE  . Drug use: No    Allergies  Allergen Reactions  . Actonel [Risedronate Sodium] Other (See Comments)    GI side effects  . Alendronate Other (See Comments)    GI side effects  . Clarithromycin Other (See Comments)    Unknown  . Erythromycin Other (See Comments)    Jaundice;  CAN TAKE MACROBID PER PATIENT  . Sulfa Antibiotics Other (See Comments)    Unknown  . Bactrim  [Sulfamethoxazole-Trimethoprim] Rash    Current Outpatient Medications  Medication Sig Dispense Refill  . acetaminophen (TYLENOL) 500 MG tablet Take 500 mg by mouth every 6 (six) hours as needed for moderate pain or headache.     . ALPRAZolam (XANAX) 0.5 MG tablet Take 1 tablet (0.5 mg total) by mouth 2 (two) times daily as needed for anxiety. 90 tablet 0  . CALCIUM-VITAMIN D PO Take 1 tablet by mouth daily.     . cetirizine (ZYRTEC ALLERGY) 10 MG tablet Take 10 mg by mouth daily as needed for allergies.     Marland Kitchen omeprazole (PRILOSEC) 20 MG capsule TAKE 1 TO 2 CAPSULES BY MOUTH EVERY DAY (Patient taking differently: Take 20 mg by mouth every evening. ) 180 capsule 0  . Polyethyl Glycol-Propyl Glycol (SYSTANE) 0.4-0.3 % SOLN Place 1-2 drops into both eyes 3 (three) times daily as needed (for dry eyes).    Marland Kitchen zolpidem (AMBIEN) 10 MG tablet TAKE 1 TABLET(10 MG) BY MOUTH AT BEDTIME AS NEEDED (Patient taking differently: Take 5 mg by mouth at bedtime. ) 30 tablet 4   No  current facility-administered medications for this visit.     Review of Systems Review of Systems  Constitutional: Negative.   Respiratory: Negative.   Cardiovascular: Negative.     Blood pressure (!) 150/80, pulse 84, resp. rate 12, height 5\' 2"  (1.575 m), weight 129 lb (58.5 kg).  Physical Exam Physical Exam  Pulmonary/Chest:         Assessment    DCIS right breast.    Plan  Patient will follow through with scheduled right breast wide excision on 02/06/2018. The patient is aware to call back for any questions or concerns.  HPI, Physical Exam, Assessment and Plan have been scribed under the direction and in the presence of Hervey Ard, MD.  Gaspar Cola, CMA  I have completed the exam and reviewed the above documentation for accuracy and completeness.  I agree with the above.  Haematologist has been used and any errors in dictation or transcription are unintentional.  Hervey Ard, M.D.,  F.A.C.S.  Forest Gleason Setareh Rom 02/04/2018, 7:39 PM

## 2018-02-03 NOTE — Patient Instructions (Addendum)
Patient is scheduled for right breast wide excision on 02/06/2018. The patient is aware to call back for any questions or concerns.

## 2018-02-05 ENCOUNTER — Other Ambulatory Visit: Payer: Self-pay

## 2018-02-06 ENCOUNTER — Ambulatory Visit
Admission: RE | Admit: 2018-02-06 | Discharge: 2018-02-06 | Disposition: A | Discharge status: Home / Self Care | Payer: Medicare Other | Source: Ambulatory Visit | Attending: General Surgery | Admitting: General Surgery

## 2018-02-06 ENCOUNTER — Ambulatory Visit: Payer: Medicare Other

## 2018-02-06 ENCOUNTER — Ambulatory Visit: Payer: Medicare Other | Admitting: Registered Nurse

## 2018-02-06 ENCOUNTER — Encounter
Admission: RE | Disposition: A | Discharge status: Home / Self Care | Payer: Self-pay | Source: Ambulatory Visit | Attending: General Surgery

## 2018-02-06 DIAGNOSIS — N6310 Unspecified lump in the right breast, unspecified quadrant: Secondary | ICD-10-CM

## 2018-02-06 DIAGNOSIS — Z882 Allergy status to sulfonamides status: Secondary | ICD-10-CM | POA: Insufficient documentation

## 2018-02-06 DIAGNOSIS — C50511 Malignant neoplasm of lower-outer quadrant of right female breast: Secondary | ICD-10-CM

## 2018-02-06 DIAGNOSIS — K219 Gastro-esophageal reflux disease without esophagitis: Secondary | ICD-10-CM | POA: Diagnosis not present

## 2018-02-06 DIAGNOSIS — Z79899 Other long term (current) drug therapy: Secondary | ICD-10-CM | POA: Diagnosis not present

## 2018-02-06 DIAGNOSIS — D0511 Intraductal carcinoma in situ of right breast: Secondary | ICD-10-CM | POA: Insufficient documentation

## 2018-02-06 DIAGNOSIS — Z881 Allergy status to other antibiotic agents status: Secondary | ICD-10-CM | POA: Insufficient documentation

## 2018-02-06 DIAGNOSIS — F329 Major depressive disorder, single episode, unspecified: Secondary | ICD-10-CM | POA: Insufficient documentation

## 2018-02-06 DIAGNOSIS — F419 Anxiety disorder, unspecified: Secondary | ICD-10-CM | POA: Diagnosis not present

## 2018-02-06 DIAGNOSIS — M1990 Unspecified osteoarthritis, unspecified site: Secondary | ICD-10-CM | POA: Diagnosis not present

## 2018-02-06 DIAGNOSIS — Z888 Allergy status to other drugs, medicaments and biological substances status: Secondary | ICD-10-CM | POA: Diagnosis not present

## 2018-02-06 HISTORY — PX: BREAST LUMPECTOMY: SHX2

## 2018-02-06 SURGERY — BREAST LUMPECTOMY
Anesthesia: General | Site: Breast | Laterality: Right

## 2018-02-06 MED ORDER — GABAPENTIN 300 MG PO CAPS
300.0000 mg | ORAL_CAPSULE | ORAL | Status: AC
  Administered 2018-02-06: 300 mg via ORAL

## 2018-02-06 MED ORDER — MIDAZOLAM HCL 2 MG/2ML IJ SOLN
INTRAMUSCULAR | Status: DC | PRN
  Administered 2018-02-06: 2 mg via INTRAVENOUS

## 2018-02-06 MED ORDER — ACETAMINOPHEN 10 MG/ML IV SOLN
INTRAVENOUS | Status: AC
  Filled 2018-02-06: qty 100

## 2018-02-06 MED ORDER — FENTANYL CITRATE (PF) 100 MCG/2ML IJ SOLN
INTRAMUSCULAR | Status: AC
  Filled 2018-02-06: qty 2

## 2018-02-06 MED ORDER — FENTANYL CITRATE (PF) 100 MCG/2ML IJ SOLN
INTRAMUSCULAR | Status: AC
  Administered 2018-02-06: 50 ug via INTRAVENOUS
  Filled 2018-02-06: qty 2

## 2018-02-06 MED ORDER — FENTANYL CITRATE (PF) 100 MCG/2ML IJ SOLN
INTRAMUSCULAR | Status: DC | PRN
  Administered 2018-02-06 (×2): 25 ug via INTRAVENOUS

## 2018-02-06 MED ORDER — GLYCOPYRROLATE 0.2 MG/ML IJ SOLN
INTRAMUSCULAR | Status: AC
  Filled 2018-02-06: qty 1

## 2018-02-06 MED ORDER — LACTATED RINGERS IV SOLN
INTRAVENOUS | Status: DC
  Administered 2018-02-06: 11:00:00 via INTRAVENOUS

## 2018-02-06 MED ORDER — DEXAMETHASONE SODIUM PHOSPHATE 10 MG/ML IJ SOLN
INTRAMUSCULAR | Status: AC
  Filled 2018-02-06: qty 1

## 2018-02-06 MED ORDER — KETOROLAC TROMETHAMINE 30 MG/ML IJ SOLN
INTRAMUSCULAR | Status: DC | PRN
  Administered 2018-02-06: 15 mg via INTRAVENOUS

## 2018-02-06 MED ORDER — SEVOFLURANE IN SOLN
RESPIRATORY_TRACT | Status: AC
  Filled 2018-02-06: qty 250

## 2018-02-06 MED ORDER — MEPERIDINE HCL 50 MG/ML IJ SOLN: 6.2500 mg | INTRAMUSCULAR | Status: DC | PRN

## 2018-02-06 MED ORDER — ACETAMINOPHEN 10 MG/ML IV SOLN
INTRAVENOUS | Status: DC | PRN
  Administered 2018-02-06: 1000 mg via INTRAVENOUS

## 2018-02-06 MED ORDER — OXYCODONE HCL 5 MG PO TABS
ORAL_TABLET | ORAL | Status: AC
  Filled 2018-02-06: qty 1

## 2018-02-06 MED ORDER — OXYCODONE HCL 5 MG PO TABS
5.0000 mg | ORAL_TABLET | Freq: Once | ORAL | Status: AC | PRN
  Administered 2018-02-06: 5 mg via ORAL

## 2018-02-06 MED ORDER — OXYCODONE HCL 5 MG/5ML PO SOLN: 5.0000 mg | Freq: Once | ORAL | Status: AC | PRN

## 2018-02-06 MED ORDER — EPHEDRINE SULFATE 50 MG/ML IJ SOLN
INTRAMUSCULAR | Status: DC | PRN
  Administered 2018-02-06: 10 mg via INTRAVENOUS
  Administered 2018-02-06: 5 mg via INTRAVENOUS

## 2018-02-06 MED ORDER — BUPIVACAINE-EPINEPHRINE (PF) 0.5% -1:200000 IJ SOLN
INTRAMUSCULAR | Status: DC | PRN
  Administered 2018-02-06: 25 mL

## 2018-02-06 MED ORDER — ONDANSETRON HCL 4 MG/2ML IJ SOLN
INTRAMUSCULAR | Status: DC | PRN
  Administered 2018-02-06: 4 mg via INTRAVENOUS

## 2018-02-06 MED ORDER — PROMETHAZINE HCL 25 MG/ML IJ SOLN: 6.2500 mg | INTRAMUSCULAR | Status: DC | PRN

## 2018-02-06 MED ORDER — TRAMADOL HCL 50 MG PO TABS: 50.0000 mg | ORAL_TABLET | Freq: Four times a day (QID) | ORAL | 0 refills | Status: DC | PRN

## 2018-02-06 MED ORDER — PROPOFOL 10 MG/ML IV BOLUS
INTRAVENOUS | Status: AC
  Filled 2018-02-06: qty 20

## 2018-02-06 MED ORDER — LIDOCAINE HCL (CARDIAC) PF 100 MG/5ML IV SOSY
PREFILLED_SYRINGE | INTRAVENOUS | Status: DC | PRN
  Administered 2018-02-06: 70 mg via INTRAVENOUS

## 2018-02-06 MED ORDER — LIDOCAINE HCL (PF) 2 % IJ SOLN
INTRAMUSCULAR | Status: AC
  Filled 2018-02-06: qty 10

## 2018-02-06 MED ORDER — PROPOFOL 10 MG/ML IV BOLUS
INTRAVENOUS | Status: DC | PRN
  Administered 2018-02-06: 80 mg via INTRAVENOUS

## 2018-02-06 MED ORDER — FENTANYL CITRATE (PF) 100 MCG/2ML IJ SOLN
25.0000 ug | INTRAMUSCULAR | Status: DC | PRN
  Administered 2018-02-06 (×4): 50 ug via INTRAVENOUS

## 2018-02-06 MED ORDER — GABAPENTIN 300 MG PO CAPS
ORAL_CAPSULE | ORAL | Status: AC
  Filled 2018-02-06: qty 1

## 2018-02-06 MED ORDER — MIDAZOLAM HCL 2 MG/2ML IJ SOLN
INTRAMUSCULAR | Status: AC
  Filled 2018-02-06: qty 2

## 2018-02-06 MED ORDER — ONDANSETRON HCL 4 MG/2ML IJ SOLN
INTRAMUSCULAR | Status: AC
  Filled 2018-02-06: qty 2

## 2018-02-06 MED ORDER — DEXAMETHASONE SODIUM PHOSPHATE 10 MG/ML IJ SOLN
INTRAMUSCULAR | Status: DC | PRN
  Administered 2018-02-06: 10 mg via INTRAVENOUS

## 2018-02-06 MED ORDER — KETOROLAC TROMETHAMINE 30 MG/ML IJ SOLN
INTRAMUSCULAR | Status: AC
  Filled 2018-02-06: qty 1

## 2018-02-06 SURGICAL SUPPLY — 52 items
BINDER BREAST LRG (GAUZE/BANDAGES/DRESSINGS) ×2 IMPLANT
BINDER BREAST XLRG (GAUZE/BANDAGES/DRESSINGS) IMPLANT
BLADE PHOTON ILLUMINATED (MISCELLANEOUS) IMPLANT
BLADE SURG 15 STRL SS SAFETY (BLADE) ×6 IMPLANT
BULB RESERV EVAC DRAIN JP 100C (MISCELLANEOUS) IMPLANT
CANISTER SUCT 1200ML W/VALVE (MISCELLANEOUS) ×3 IMPLANT
CHLORAPREP W/TINT 26ML (MISCELLANEOUS) ×3 IMPLANT
CLOSURE WOUND 1/2 X4 (GAUZE/BANDAGES/DRESSINGS) ×1
CNTNR SPEC 2.5X3XGRAD LEK (MISCELLANEOUS) ×1
CONT SPEC 4OZ STER OR WHT (MISCELLANEOUS) ×2
CONT SPEC 4OZ STRL OR WHT (MISCELLANEOUS) ×1
CONTAINER SPEC 2.5X3XGRAD LEK (MISCELLANEOUS) ×1 IMPLANT
COVER PROBE FLX POLY STRL (MISCELLANEOUS) ×3 IMPLANT
COVER WAND RF STERILE (DRAPES) ×3 IMPLANT
DEVICE DUBIN SPECIMEN MAMMOGRA (MISCELLANEOUS) ×3 IMPLANT
DRAIN CHANNEL JP 15F RND 16 (MISCELLANEOUS) IMPLANT
DRAPE LAPAROTOMY TRNSV 106X77 (MISCELLANEOUS) ×3 IMPLANT
DRSG GAUZE FLUFF 36X18 (GAUZE/BANDAGES/DRESSINGS) ×3 IMPLANT
DRSG TELFA 3X8 NADH (GAUZE/BANDAGES/DRESSINGS) ×3 IMPLANT
ELECT CAUTERY BLADE TIP 2.5 (TIP) ×3
ELECT REM PT RETURN 9FT ADLT (ELECTROSURGICAL) ×3
ELECTRODE CAUTERY BLDE TIP 2.5 (TIP) ×1 IMPLANT
ELECTRODE REM PT RTRN 9FT ADLT (ELECTROSURGICAL) ×1 IMPLANT
GLOVE BIO SURGEON STRL SZ7.5 (GLOVE) ×3 IMPLANT
GLOVE INDICATOR 8.0 STRL GRN (GLOVE) ×3 IMPLANT
GOWN STRL REUS W/ TWL LRG LVL3 (GOWN DISPOSABLE) ×2 IMPLANT
GOWN STRL REUS W/TWL LRG LVL3 (GOWN DISPOSABLE) ×6
KIT TURNOVER KIT A (KITS) ×3 IMPLANT
LABEL OR SOLS (LABEL) ×3 IMPLANT
MARGIN MAP 10MM (MISCELLANEOUS) ×3 IMPLANT
NDL HYPO 25X1 1.5 SAFETY (NEEDLE) ×2 IMPLANT
NEEDLE HYPO 22GX1.5 SAFETY (NEEDLE) ×3 IMPLANT
NEEDLE HYPO 25X1 1.5 SAFETY (NEEDLE) ×6 IMPLANT
PACK BASIN MINOR ARMC (MISCELLANEOUS) ×3 IMPLANT
PAD DRESSING TELFA 3X8 NADH (GAUZE/BANDAGES/DRESSINGS) ×1 IMPLANT
SHEARS FOC LG CVD HARMONIC 17C (MISCELLANEOUS) IMPLANT
SHEARS HARMONIC 9CM CVD (BLADE) IMPLANT
STRIP CLOSURE SKIN 1/2X4 (GAUZE/BANDAGES/DRESSINGS) ×2 IMPLANT
SUT ETHILON 3-0 FS-10 30 BLK (SUTURE) ×3
SUT SILK 2 0 (SUTURE) ×3
SUT SILK 2-0 18XBRD TIE 12 (SUTURE) ×1 IMPLANT
SUT VIC AB 2-0 CT1 27 (SUTURE) ×6
SUT VIC AB 2-0 CT1 TAPERPNT 27 (SUTURE) ×2 IMPLANT
SUT VIC AB 4-0 FS2 27 (SUTURE) ×3 IMPLANT
SUT VICRYL+ 3-0 144IN (SUTURE) ×3 IMPLANT
SUTURE EHLN 3-0 FS-10 30 BLK (SUTURE) ×1 IMPLANT
SWABSTK COMLB BENZOIN TINCTURE (MISCELLANEOUS) ×3 IMPLANT
SYR 10ML LL (SYRINGE) ×3 IMPLANT
SYR BULB IRRIG 60ML STRL (SYRINGE) ×3 IMPLANT
SYR CONTROL 10ML (SYRINGE) ×3 IMPLANT
TAPE TRANSPORE STRL 2 31045 (GAUZE/BANDAGES/DRESSINGS) ×3 IMPLANT
WATER STERILE IRR 1000ML POUR (IV SOLUTION) ×3 IMPLANT

## 2018-02-06 NOTE — Anesthesia Procedure Notes (Signed)
Procedure Name: LMA Insertion Date/Time: 02/06/2018 1:07 PM Performed by: Doreen Salvage, CRNA Pre-anesthesia Checklist: Patient identified, Patient being monitored, Timeout performed, Emergency Drugs available and Suction available Patient Re-evaluated:Patient Re-evaluated prior to induction Oxygen Delivery Method: Circle system utilized Preoxygenation: Pre-oxygenation with 100% oxygen Induction Type: IV induction Ventilation: Mask ventilation without difficulty LMA: LMA inserted LMA Size: 4.0 Tube type: Oral Number of attempts: 1 Placement Confirmation: positive ETCO2 and breath sounds checked- equal and bilateral Tube secured with: Tape Dental Injury: Teeth and Oropharynx as per pre-operative assessment

## 2018-02-06 NOTE — H&P (Signed)
No change in clinical history or exam. For right breast wide excision.

## 2018-02-06 NOTE — Anesthesia Preprocedure Evaluation (Signed)
Anesthesia Evaluation  Patient identified by MRN, date of birth, ID band Patient awake    Reviewed: Allergy & Precautions, NPO status , Patient's Chart, lab work & pertinent test results  History of Anesthesia Complications Negative for: history of anesthetic complications  Airway Mallampati: II  TM Distance: >3 FB Neck ROM: Full    Dental no notable dental hx.    Pulmonary neg pulmonary ROS, neg sleep apnea, neg COPD,    breath sounds clear to auscultation- rhonchi (-) wheezing      Cardiovascular Exercise Tolerance: Good (-) hypertension(-) CAD, (-) Past MI, (-) Cardiac Stents and (-) CABG  Rhythm:Regular Rate:Normal - Systolic murmurs and - Diastolic murmurs    Neuro/Psych  Headaches, PSYCHIATRIC DISORDERS Anxiety Depression    GI/Hepatic Neg liver ROS, GERD  ,  Endo/Other  negative endocrine ROSneg diabetes  Renal/GU negative Renal ROS     Musculoskeletal  (+) Arthritis ,   Abdominal (+) - obese,   Peds  Hematology negative hematology ROS (+)   Anesthesia Other Findings Past Medical History: No date: Anxiety No date: Arthritis No date: Cancer [facility])     Comment:  DCIS 2018: Colon polyp No date: GERD (gastroesophageal reflux disease) No date: Hematuria 2016, 2019: Plantar fasciitis     Comment:  right foot   Reproductive/Obstetrics                             Anesthesia Physical Anesthesia Plan  ASA: II  Anesthesia Plan: General   Post-op Pain Management:    Induction: Intravenous  PONV Risk Score and Plan: 2 and Ondansetron, Dexamethasone and Midazolam  Airway Management Planned: LMA  Additional Equipment:   Intra-op Plan:   Post-operative Plan:   Informed Consent: I have reviewed the patients History and Physical, chart, labs and discussed the procedure including the risks, benefits and alternatives for the proposed anesthesia with the patient or authorized  representative who has indicated his/her understanding and acceptance.   Dental advisory given  Plan Discussed with: CRNA and Anesthesiologist  Anesthesia Plan Comments:         Anesthesia Quick Evaluation

## 2018-02-06 NOTE — OR Nursing (Signed)
Discussed discharge instructions with pt and husband. Both voice understanding. 

## 2018-02-06 NOTE — Op Note (Signed)
Preoperative diagnosis: High-grade DCIS right lower inner quadrant.  Postoperative diagnosis: Same.  Operative procedure: Wide local excision of right breast DCIS with ultrasound guidance.  Operating surgeon: Hervey Ard, MD  Anesthesia: General by LMA, Marcaine 0.5% with 1 to 200,000 units of epinephrine, 25 cc.  Estimated blood loss: Less than 5 cc.  Clinical note: This 72 year old woman was recently identified with an abnormal mammogram and stereotactic biopsy showed evidence of high-grade DCIS.  She was felt to be a candidate for breast conservation.  Operative note: With the patient under adequate general anesthesia the area was cleansed with ChloraPrep and draped.  Ultrasound was used to confirm the location of the previous biopsy cavity.  Local anesthesia was infiltrated as a field block for postoperative analgesia.  A radial incision was made at the 5 o'clock position and the skin incised sharply.  The biopsy cavity cane within 4 millimeters of the skin.  The adipose tissue was taken off this area to raise thin flaps and then a block of tissue excised about 3 x 3 x 3 cm.  Specimen was orientated and the previously placed biopsy clip was adjacent to the skin marker.  Hemostasis was with 3-0 Vicryl ties and electrocautery.  The breast parenchyma was elevated off the underlying pectoralis muscle and the fascia approximated with interrupted 2-0 Vicryl sutures.  On the inferior flap this was freed down to the inframammary fold to remove prevent rippling of the underlying tissue.  The breast parenchyma was approximated with interrupted 2-0 Vicryl sutures.  The skin was closed with a running 4-0 Vicryl subcuticular suture.  Benzoin Steri-Strips followed by Telfa, fluff gauze and a compressive wrap were applied.  The patient tolerated the procedure well and was taken to the recovery room in stable condition

## 2018-02-06 NOTE — Anesthesia Postprocedure Evaluation (Signed)
Anesthesia Post Note  [name]  Procedure(s) Performed: BREAST WIDE EXCISION (Right Breast)  Patient location during evaluation: PACU Anesthesia Type: General Level of consciousness: awake and alert Pain management: pain level controlled Vital Signs Assessment: post-procedure vital signs reviewed and stable Respiratory status: spontaneous breathing, nonlabored ventilation, respiratory function stable and patient connected to nasal cannula oxygen Cardiovascular status: blood pressure returned to baseline and stable Postop Assessment: no apparent nausea or vomiting Anesthetic complications: no     Last Vitals:  Vitals:   02/06/18 1530 02/06/18 1540  BP: (!) 138/57 133/69  Pulse: 65 64  Resp: 16 16  Temp:    SpO2: 100% 100%    Last Pain:  Vitals:   02/06/18 1530  TempSrc:   PainSc: 3                  [name] S

## 2018-02-06 NOTE — Transfer of Care (Signed)
Immediate Anesthesia Transfer of Care Note  Patient: STACYE NOORI  Procedure(s) Performed: BREAST WIDE EXCISION (Right Breast)  Patient Location: PACU  Anesthesia Type:General  Level of Consciousness: drowsy  Airway & Oxygen Therapy: Patient Spontanous Breathing and Patient connected to face mask oxygen  Post-op Assessment: Report given to RN and Post -op Vital signs reviewed and stable  Post vital signs: Reviewed and stable  Last Vitals:  Vitals Value Taken Time  BP 116/47 02/06/2018  2:06 PM  Temp    Pulse 64 02/06/2018  2:07 PM  Resp 11 02/06/2018  2:07 PM  SpO2 100 % 02/06/2018  2:07 PM  Vitals shown include unvalidated device data.  Last Pain:  Vitals:   02/06/18 1100  TempSrc: Tympanic  PainSc: 0-No pain         Complications: No apparent anesthesia complications

## 2018-02-06 NOTE — Anesthesia Post-op Follow-up Note (Signed)
Anesthesia QCDR form completed.        

## 2018-02-06 NOTE — Discharge Instructions (Signed)
AMBULATORY SURGERY  °DISCHARGE INSTRUCTIONS ° ° °1) The drugs that you were given will stay in your system until tomorrow so for the next 24 hours you should not: ° °A) Drive an automobile °B) Make any legal decisions °C) Drink any alcoholic beverage ° ° °2) You may resume regular meals tomorrow.  Today it is better to start with liquids and gradually work up to solid foods. ° °You may eat anything you prefer, but it is better to start with liquids, then soup and crackers, and gradually work up to solid foods. ° ° °3) Please notify your doctor immediately if you have any unusual bleeding, trouble breathing, redness and pain at the surgery site, drainage, fever, or pain not relieved by medication. ° ° ° °4) Additional Instructions: ° ° ° ° ° ° ° °Please contact your physician with any problems or Same Day Surgery at [phone], Monday through Friday 6 am to 4 pm, or [place] [facility] [place] Main number at [phone]. °

## 2018-02-07 ENCOUNTER — Encounter: Payer: Self-pay | Admitting: General Surgery

## 2018-02-10 LAB — SURGICAL PATHOLOGY

## 2018-02-11 ENCOUNTER — Telehealth: Payer: Self-pay

## 2018-02-11 LAB — SURGICAL PATHOLOGY

## 2018-02-11 NOTE — Telephone Encounter (Signed)
-----   Message from Robert Bellow, MD sent at 02/11/2018  2:14 PM EDT ----- Please notify the patient pathology OK. No other problems identified.  Margins are fine. Will discuss details at f/u.  ----- Message ----- From: Interface, Lab In Three Zero One Sent: 02/10/2018   1:15 PM EDT To: Robert Bellow, MD

## 2018-02-11 NOTE — Telephone Encounter (Signed)
Notified patient as instructed, patient pleased. Discussed follow-up appointments, patient agrees  

## 2018-02-12 ENCOUNTER — Ambulatory Visit (INDEPENDENT_AMBULATORY_CARE_PROVIDER_SITE_OTHER): Payer: Medicare Other | Admitting: General Surgery

## 2018-02-12 ENCOUNTER — Other Ambulatory Visit: Payer: Self-pay

## 2018-02-12 ENCOUNTER — Ambulatory Visit: Payer: Self-pay

## 2018-02-12 ENCOUNTER — Encounter: Payer: Self-pay | Admitting: General Surgery

## 2018-02-12 VITALS — BP 164/86 | HR 80 | Temp 98.1°F | Resp 18 | Ht 62.0 in | Wt 129.4 lb

## 2018-02-12 DIAGNOSIS — D0511 Intraductal carcinoma in situ of right breast: Secondary | ICD-10-CM

## 2018-02-12 DIAGNOSIS — S83249A Other tear of medial meniscus, current injury, unspecified knee, initial encounter: Secondary | ICD-10-CM | POA: Insufficient documentation

## 2018-02-12 DIAGNOSIS — M25569 Pain in unspecified knee: Secondary | ICD-10-CM | POA: Insufficient documentation

## 2018-02-12 NOTE — Progress Notes (Signed)
Patient ID: Sherri Cisneros, female   DOB: [birth year], 72 y.o.   MRN: [id number]  Chief Complaint  Patient presents with  . Routine Post Op    Right breast wide excision    HPI Sherri Cisneros is a 72 y.o. female.  Here today for post operative care of right breast wide excision. Complains of right breast soreness. Denies fever or chills. No drainage. HPI  Past Medical History:  Diagnosis Date  . Anxiety   . Arthritis   . Cancer ([place])    DCIS  . Colon polyp 2018  . GERD (gastroesophageal reflux disease)   . Hematuria   . Plantar fasciitis 2016, 2019   right foot    Past Surgical History:  Procedure Laterality Date  . BREAST BIOPSY Right 01/21/2018   affirm bx ribbon clip DUCTAL CARCINOMA IN SITU, HIGH-GRADE COMEDO TYPE    . BREAST EXCISIONAL BIOPSY Right 1998   negative, Dr Bary Castilla  . BREAST LUMPECTOMY Right 02/06/2018   Procedure: BREAST WIDE EXCISION;  Surgeon: Robert Bellow, MD;  Location: [facility] ORS;  Service: General;  Laterality: Right;  . COLONOSCOPY  2018   Dr Vira Agar  . HAND SURGERY Right   . LYMPH NODE DISSECTION     removed from back oh neck  . NASAL SINUS SURGERY    . TUBAL LIGATION      Family History  Problem Relation Age of Onset  . Hypertension Mother   . CVA Mother   . Thyroid disease Mother   . Seizures Mother   . Stroke Father   . Heart attack Father   . Alcohol abuse Sister   . Breast cancer Maternal Grandmother   . Heart attack Paternal Grandmother   . CVA Paternal Grandmother   . CVA Paternal Grandfather   . Heart attack Paternal Grandfather   . Colon cancer Neg Hx     Social History Social History   Tobacco Use  . Smoking status: Never Smoker  . Smokeless tobacco: Never Used  Substance Use Topics  . Alcohol use: Yes    Alcohol/week: 0.0 - 1.0 standard drinks    Comment: OCC WINE  . Drug use: No    Allergies  Allergen Reactions  . Actonel [Risedronate Sodium] Other (See Comments)    GI side effects  . Alendronate Other  (See Comments)    GI side effects  . Clarithromycin Other (See Comments)    Unknown  . Erythromycin Other (See Comments)    Jaundice;  CAN TAKE MACROBID PER PATIENT  . Sulfa Antibiotics Other (See Comments)    Unknown  . Bactrim [Sulfamethoxazole-Trimethoprim] Rash    Current Outpatient Medications  Medication Sig Dispense Refill  . acetaminophen (TYLENOL) 500 MG tablet Take 500 mg by mouth every 6 (six) hours as needed for moderate pain or headache.     . ALPRAZolam (XANAX) 0.5 MG tablet Take 1 tablet (0.5 mg total) by mouth 2 (two) times daily as needed for anxiety. 90 tablet 0  . CALCIUM-VITAMIN D PO Take 1 tablet by mouth daily.     . cetirizine (ZYRTEC ALLERGY) 10 MG tablet Take 10 mg by mouth daily as needed for allergies.     Marland Kitchen omeprazole (PRILOSEC) 20 MG capsule TAKE 1 TO 2 CAPSULES BY MOUTH EVERY DAY (Patient taking differently: Take 20 mg by mouth every evening. ) 180 capsule 0  . Polyethyl Glycol-Propyl Glycol (SYSTANE) 0.4-0.3 % SOLN Place 1-2 drops into both eyes 3 (three) times daily as needed (for  dry eyes).    . traMADol (ULTRAM) 50 MG tablet Take 1 tablet (50 mg total) by mouth every 6 (six) hours as needed. 15 tablet 0  . zolpidem (AMBIEN) 10 MG tablet TAKE 1 TABLET(10 MG) BY MOUTH AT BEDTIME AS NEEDED (Patient taking differently: Take 5 mg by mouth at bedtime. ) 30 tablet 4   No current facility-administered medications for this visit.     Review of Systems Review of Systems  Constitutional: Negative.   Eyes: Negative.   Skin: Negative.   Psychiatric/Behavioral: Negative.     Blood pressure (!) 164/86, pulse 80, temperature 98.1 F (36.7 C), temperature source Temporal, resp. rate 18, height 5\' 2"  (1.575 m), weight 129 lb 6.4 oz (58.7 kg), SpO2 100 %.  Physical Exam Physical Exam  Constitutional: She is oriented to person, place, and time. She appears well-developed and well-nourished.  Eyes: Conjunctivae are normal. No scleral icterus.  Neck: Normal range  of motion.  Cardiovascular: Normal rate, regular rhythm and normal heart sounds.  Pulmonary/Chest: Effort normal and breath sounds normal. Right breast exhibits no inverted nipple, no mass, no nipple discharge, no skin change and no tenderness. Left breast exhibits no inverted nipple, no mass, no nipple discharge, no skin change and no tenderness.  Lymphadenopathy:    She has no cervical adenopathy.  Neurological: She is alert and oriented to person, place, and time.  Skin: Skin is warm and dry.    Data Reviewed Final pathology revealed a 3 mm area of high-grade DCIS.  Margins widely negative (6 mm). ER/PR analysis was completed on the original biopsy specimen due to the minimal residual disease.   ER/PR negative.  Ultrasound was completed to determine if the patient would be a candidate for accelerated partial breast radiation.  There is a small seroma cavity just 0.3 cm below the skin measuring up to 2.6 cm in diameter.  Inadequate spacing for balloon placement.   Assessment    Doing well post wide excision of small area of high-grade DCIS.    Plan    The patient has widely clear margins, a very small tumor and serious question can be raised regarding the potential benefit from adjuvant radiation therapy.  Will obtain consultation from Dr. Baruch Gouty in this regard..      HPI, Physical Exam, Assessment and Plan have been scribed under the direction and in the presence of Robert Bellow, MD. Jonnie Finner, CMA  Forest Gleason [facility] 02/14/2018, 10:12 AM  Patient has been scheduled for an appointment with Dr. Baruch Gouty at the [facility] for 02-25-18 at 9:30 am. (She is not a mammosite candidate per Dr. Bary Castilla.) The patient is aware of date, time, and instructions.  Dominga Ferry, CMA

## 2018-02-12 NOTE — Patient Instructions (Signed)
We will send the referral to Radiation Oncology and someone from their office will contact you.

## 2018-02-17 NOTE — Progress Notes (Signed)
  Oncology Nurse Navigator Documentation  Navigator Location: [facility] (02/17/18 1300)   )Navigator Encounter Type: Telephone (02/17/18 1300) Telephone: Incoming Call;Outgoing Call;Appt Confirmation/Clarification;Education (02/17/18 1300)                   Patient Visit Type: Follow-up (02/17/18 1300)       Interventions: Education (02/17/18 1300)                      Time Spent with Patient: 15 (02/17/18 1300)   Spoke to patient and answered questions about upcoming appointment with Dr. Baruch Gouty on 02/25/18.

## 2018-02-19 ENCOUNTER — Other Ambulatory Visit: Payer: Self-pay | Admitting: *Deleted

## 2018-02-25 ENCOUNTER — Ambulatory Visit
Admission: RE | Admit: 2018-02-25 | Discharge: 2018-02-25 | Disposition: A | Discharge status: Home / Self Care | Payer: Medicare Other | Source: Ambulatory Visit | Attending: Radiation Oncology | Admitting: Radiation Oncology

## 2018-02-25 ENCOUNTER — Encounter: Payer: Self-pay | Admitting: Radiation Oncology

## 2018-02-25 ENCOUNTER — Other Ambulatory Visit: Payer: Self-pay

## 2018-02-25 VITALS — BP 142/83 | HR 81 | Temp 97.9°F | Resp 18 | Wt 130.1 lb

## 2018-02-25 DIAGNOSIS — D0511 Intraductal carcinoma in situ of right breast: Secondary | ICD-10-CM | POA: Diagnosis not present

## 2018-02-25 DIAGNOSIS — M129 Arthropathy, unspecified: Secondary | ICD-10-CM | POA: Insufficient documentation

## 2018-02-25 DIAGNOSIS — F419 Anxiety disorder, unspecified: Secondary | ICD-10-CM | POA: Insufficient documentation

## 2018-02-25 DIAGNOSIS — Z171 Estrogen receptor negative status [ER-]: Secondary | ICD-10-CM | POA: Insufficient documentation

## 2018-02-25 DIAGNOSIS — K219 Gastro-esophageal reflux disease without esophagitis: Secondary | ICD-10-CM | POA: Insufficient documentation

## 2018-02-25 DIAGNOSIS — Z803 Family history of malignant neoplasm of breast: Secondary | ICD-10-CM | POA: Insufficient documentation

## 2018-02-25 DIAGNOSIS — Z79899 Other long term (current) drug therapy: Secondary | ICD-10-CM | POA: Insufficient documentation

## 2018-02-25 DIAGNOSIS — Z86010 Personal history of colonic polyps: Secondary | ICD-10-CM | POA: Insufficient documentation

## 2018-02-25 NOTE — Consult Note (Signed)
NEW PATIENT EVALUATION  Name: Sherri Cisneros  MRN: [id number]  Date:   02/25/2018     DOB: 13-Feb-1946   This 72 y.o. 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  REFERRING PHYSICIAN: Jerrol Banana.,*  CHIEF COMPLAINT:  Chief Complaint  Patient presents with  . Breast Cancer    Pt is here for initial consultation of breast cancer    DIAGNOSIS: The encounter diagnosis was Ductal carcinoma in situ (DCIS) of right breast.   PREVIOUS INVESTIGATIONS:  Mammogram and ultrasound reviewed Pathology reports reviewed  clinical notes reviewed  HPI: patient is a 72 year old female who presented with an abnormal mammogram of her right breast showing new right breast calcifications. This prompted biopsy which was positive for high-grade ductal carcinoma in situ.tumor was ER/PR negative. She went on to have a wide local excision showing residual high-grade ductal carcinoma in situ comedo type. Size was at least 3 mm. Margins were clear at at least 6 mm. Tumor is grade 3 with expansive comedo necrosis present. She has done well postoperatively. She specifically denies breast tenderness cough or bone pain. She had ultrasound to determine her seroma cavity for possible MammoSite balloon placement although that it was insufficient. She is now referred to radiation oncology for opinion.  PLANNED TREATMENT REGIMEN: right whole breast radiation possibly hypofractionated  PAST MEDICAL HISTORY:  has a past medical history of Anxiety, Arthritis, Cancer ([facility]), Colon polyp (2018), GERD (gastroesophageal reflux disease), Hematuria, and Plantar fasciitis (2016, 2019).    PAST SURGICAL HISTORY:  Past Surgical History:  Procedure Laterality Date  . BREAST BIOPSY Right 01/21/2018   affirm bx ribbon clip DUCTAL CARCINOMA IN SITU, HIGH-GRADE COMEDO TYPE    . BREAST EXCISIONAL BIOPSY Right 1998    negative, Dr Bary Castilla  . BREAST LUMPECTOMY Right 02/06/2018   Procedure: BREAST WIDE EXCISION;  Surgeon: Robert Bellow, MD;  Location: [facility] OR[facility];  Service: General;  Laterality: Right;  . COLONOSCOPY  2018   Dr Vira Agar  . HAND SURGERY Right   . LYMPH NODE DISSECTION     removed from back oh neck  . NASAL SINUS SURGERY    . TUBAL LIGATION      FAMILY HISTORY: family history includes Alcohol abuse in her sister; Breast cancer in her maternal grandmother; CVA in her mother, paternal grandfather, and paternal grandmother; Heart attack in her father, paternal grandfather, and paternal grandmother; Hypertension in her mother; Seizures in her mother; Stroke in her father; Thyroid disease in her mother.  SOCIAL HISTORY:  reports that she has never smoked. She has never used smokeless tobacco. She reports that she drinks alcohol. She reports that she does not use drugs.  ALLERGIES: Actonel [risedronate sodium]; Alendronate; Clarithromycin; Erythromycin; Sulfa antibiotics; and Bactrim [sulfamethoxazole-trimethoprim]  MEDICATIONS:  Current Outpatient Medications  Medication Sig Dispense Refill  . acetaminophen (TYLENOL) 500 MG tablet Take 500 mg by mouth every 6 (six) hours as needed for moderate pain or headache.     . ALPRAZolam (XANAX) 0.5 MG tablet Take 1 tablet (0.5 mg total) by mouth 2 (two) times daily as needed for anxiety. 90 tablet 0  . CALCIUM-VITAMIN D PO Take 1 tablet by mouth daily.     . cetirizine (ZYRTEC ALLERGY) 10 MG tablet Take 10 mg by mouth daily as needed for allergies.     Marland Kitchen omeprazole (PRILOSEC) 20 MG capsule TAKE 1 TO 2 CAPSULES BY  MOUTH EVERY DAY (Patient taking differently: Take 20 mg by mouth every evening. ) 180 capsule 0  . Polyethyl Glycol-Propyl Glycol (SYSTANE) 0.4-0.3 % SOLN Place 1-2 drops into both eyes 3 (three) times daily as needed (for dry eyes).    Marland Kitchen zolpidem (AMBIEN) 10 MG tablet TAKE 1 TABLET(10 MG) BY MOUTH AT BEDTIME AS NEEDED (Patient taking  differently: Take 5 mg by mouth at bedtime. ) 30 tablet 4  . traMADol (ULTRAM) 50 MG tablet Take 1 tablet (50 mg total) by mouth every 6 (six) hours as needed. (Patient not taking: Reported on 02/25/2018) 15 tablet 0   No current facility-administered medications for this encounter.     ECOG PERFORMANCE STATUS:  0 - Asymptomatic  REVIEW OF SYSTEMS:  Patient denies any weight loss, fatigue, weakness, fever, chills or night sweats. Patient denies any loss of vision, blurred vision. Patient denies any ringing  of the ears or hearing loss. No irregular heartbeat. Patient denies heart murmur or history of fainting. Patient denies any chest pain or pain radiating to her upper extremities. Patient denies any shortness of breath, difficulty breathing at night, cough or hemoptysis. Patient denies any swelling in the lower legs. Patient denies any nausea vomiting, vomiting of blood, or coffee ground material in the vomitus. Patient denies any stomach pain. Patient states has had normal bowel movements no significant constipation or diarrhea. Patient denies any dysuria, hematuria or significant nocturia. Patient denies any problems walking, swelling in the joints or loss of balance. Patient denies any skin changes, loss of hair or loss of weight. Patient denies any excessive worrying or anxiety or significant depression. Patient denies any problems with insomnia. Patient denies excessive thirst, polyuria, polydipsia. Patient denies any swollen glands, patient denies easy bruising or easy bleeding. Patient denies any recent infections, allergies or URI. Patient "s visual fields have not changed significantly in recent time.    PHYSICAL EXAM: BP (!) 142/83 (BP Location: Left Arm, Patient Position: Sitting)   Pulse 81   Temp 97.9 F (36.6 C) (Tympanic)   Resp 18   Wt 130 lb 1.1 oz (59 kg)   BMI 23.79 kg/m  Right breast is wide local excision scar which is healed well. There is a seroma cavity present which is  firm. No other dominant mass or nodularity is noted in either breast in 2 positions examined. No axillary or supraclavicular adenopathy is noted.Well-developed well-nourished patient in NAD. HEENT reveals PERLA, EOMI, discs not visualized.  Oral cavity is clear. No oral mucosal lesions are identified. Neck is clear without evidence of cervical or supraclavicular adenopathy. Lungs are clear to A&P. Cardiac examination is essentially unremarkable with regular rate and rhythm without murmur rub or thrill. Abdomen is benign with no organomegaly or masses noted. Motor sensory and DTR levels are equal and symmetric in the upper and lower extremities. Cranial nerves II through XII are grossly intact. Proprioception is intact. No peripheral adenopathy or edema is identified. No motor or sensory levels are noted. Crude visual fields are within normal range.  LABORATORY DATA: pathology reports reviewed    RADIOLOGY RESULTS:mammogram and ultrasound reviewed   IMPRESSION: stage 0 (Tis N0 M0) ductal carcinoma in situ ER/PR negative of the right breast status post wide local excision in 72 year old female  PLAN: at this time will recommend whole breast radiation. I believe she may be a candidate for hyperfractionated course of treatment over 4 weeks. Would also boost her scar another 1400 cGy using electron beam. Risks and benefits of  treatment including skin reaction fatigue alteration of blood counts possible inclusion of superficial lung all were discussed in detail with the patient. She seems to comprehend my treatment plan well. I personally set up and ordered CT simulation for early next week to allow some further healing. Patient will not be candidate for antiestrogen therapy after completion of radiation based on the ER/PR negative status of her disease.  I would like to take this opportunity to thank you for allowing me to participate in the care of your patient.Noreene Filbert, MD

## 2018-03-02 ENCOUNTER — Ambulatory Visit
Admission: RE | Admit: 2018-03-02 | Discharge: 2018-03-02 | Disposition: A | Discharge status: Home / Self Care | Payer: Medicare Other | Source: Ambulatory Visit | Attending: Radiation Oncology | Admitting: Radiation Oncology

## 2018-03-02 DIAGNOSIS — D0511 Intraductal carcinoma in situ of right breast: Secondary | ICD-10-CM | POA: Insufficient documentation

## 2018-03-02 DIAGNOSIS — Z510 Encounter for antineoplastic radiation therapy: Secondary | ICD-10-CM | POA: Insufficient documentation

## 2018-03-03 DIAGNOSIS — Z510 Encounter for antineoplastic radiation therapy: Secondary | ICD-10-CM | POA: Diagnosis not present

## 2018-03-06 ENCOUNTER — Other Ambulatory Visit: Payer: Self-pay | Admitting: *Deleted

## 2018-03-06 DIAGNOSIS — D0511 Intraductal carcinoma in situ of right breast: Secondary | ICD-10-CM

## 2018-03-09 ENCOUNTER — Ambulatory Visit
Admission: RE | Admit: 2018-03-09 | Discharge: 2018-03-09 | Disposition: A | Discharge status: Home / Self Care | Payer: Medicare Other | Source: Ambulatory Visit | Attending: Radiation Oncology | Admitting: Radiation Oncology

## 2018-03-09 DIAGNOSIS — Z510 Encounter for antineoplastic radiation therapy: Secondary | ICD-10-CM | POA: Diagnosis not present

## 2018-03-10 ENCOUNTER — Ambulatory Visit
Admission: RE | Admit: 2018-03-10 | Discharge: 2018-03-10 | Disposition: A | Discharge status: Home / Self Care | Payer: Medicare Other | Source: Ambulatory Visit | Attending: Radiation Oncology | Admitting: Radiation Oncology

## 2018-03-10 DIAGNOSIS — Z510 Encounter for antineoplastic radiation therapy: Secondary | ICD-10-CM | POA: Diagnosis not present

## 2018-03-11 ENCOUNTER — Ambulatory Visit
Admission: RE | Admit: 2018-03-11 | Discharge: 2018-03-11 | Disposition: A | Discharge status: Home / Self Care | Payer: Medicare Other | Source: Ambulatory Visit | Attending: Radiation Oncology | Admitting: Radiation Oncology

## 2018-03-11 DIAGNOSIS — Z510 Encounter for antineoplastic radiation therapy: Secondary | ICD-10-CM | POA: Diagnosis not present

## 2018-03-12 ENCOUNTER — Ambulatory Visit
Admission: RE | Admit: 2018-03-12 | Discharge: 2018-03-12 | Disposition: A | Discharge status: Home / Self Care | Payer: Medicare Other | Source: Ambulatory Visit | Attending: Radiation Oncology | Admitting: Radiation Oncology

## 2018-03-12 DIAGNOSIS — Z510 Encounter for antineoplastic radiation therapy: Secondary | ICD-10-CM | POA: Diagnosis not present

## 2018-03-13 ENCOUNTER — Ambulatory Visit
Admission: RE | Admit: 2018-03-13 | Discharge: 2018-03-13 | Disposition: A | Discharge status: Home / Self Care | Payer: Medicare Other | Source: Ambulatory Visit | Attending: Radiation Oncology | Admitting: Radiation Oncology

## 2018-03-13 DIAGNOSIS — Z510 Encounter for antineoplastic radiation therapy: Secondary | ICD-10-CM | POA: Diagnosis not present

## 2018-03-16 ENCOUNTER — Ambulatory Visit
Admission: RE | Admit: 2018-03-16 | Discharge: 2018-03-16 | Disposition: A | Discharge status: Home / Self Care | Payer: Medicare Other | Source: Ambulatory Visit | Attending: Radiation Oncology | Admitting: Radiation Oncology

## 2018-03-16 DIAGNOSIS — Z510 Encounter for antineoplastic radiation therapy: Secondary | ICD-10-CM | POA: Diagnosis not present

## 2018-03-17 ENCOUNTER — Encounter: Payer: Self-pay | Admitting: General Surgery

## 2018-03-17 ENCOUNTER — Ambulatory Visit
Admission: RE | Admit: 2018-03-17 | Discharge: 2018-03-17 | Disposition: A | Discharge status: Home / Self Care | Payer: Medicare Other | Source: Ambulatory Visit | Attending: Radiation Oncology | Admitting: Radiation Oncology

## 2018-03-17 ENCOUNTER — Ambulatory Visit (INDEPENDENT_AMBULATORY_CARE_PROVIDER_SITE_OTHER): Payer: Medicare Other | Admitting: General Surgery

## 2018-03-17 ENCOUNTER — Other Ambulatory Visit: Payer: Self-pay

## 2018-03-17 VITALS — BP 142/80 | HR 70 | Resp 16 | Ht 62.0 in | Wt 129.0 lb

## 2018-03-17 DIAGNOSIS — D0511 Intraductal carcinoma in situ of right breast: Secondary | ICD-10-CM

## 2018-03-17 DIAGNOSIS — Z510 Encounter for antineoplastic radiation therapy: Secondary | ICD-10-CM | POA: Diagnosis not present

## 2018-03-17 NOTE — Progress Notes (Signed)
Patient ID: Sherri Cisneros, female   DOB: 1945-08-11, 72 y.o.   MRN: [id number]  Chief Complaint  Patient presents with  . Follow-up    HPI Sherri Cisneros is a 72 y.o. female.  Here for follow up right breast DCIS. She is doing well with radiation. No new breast issues.  HPI  Past Medical History:  Diagnosis Date  . Anxiety   . Arthritis   . Cancer ([facility])    DCIS, high-grade.  ER/PR negative  . Colon polyp 2018  . GERD (gastroesophageal reflux disease)   . Hematuria   . Plantar fasciitis 2016, 2019   right foot    Past Surgical History:  Procedure Laterality Date  . BREAST BIOPSY Right 01/21/2018   affirm bx ribbon clip DUCTAL CARCINOMA IN SITU, HIGH-GRADE COMEDO TYPE    . BREAST EXCISIONAL BIOPSY Right 1998   negative, Dr Bary Castilla  . BREAST LUMPECTOMY Right 02/06/2018   Procedure: BREAST WIDE EXCISION;  Surgeon: Robert Bellow, MD;  Location: [facility];  Service: General;  Laterality: Right;  . COLONOSCOPY  2018   Dr Vira Agar  . HAND SURGERY Right   . LYMPH NODE DISSECTION     removed from back oh neck  . NASAL SINUS SURGERY    . TUBAL LIGATION      Family History  Problem Relation Age of Onset  . Hypertension Mother   . CVA Mother   . Thyroid disease Mother   . Seizures Mother   . Stroke Father   . Heart attack Father   . Alcohol abuse Sister   . Breast cancer Maternal Grandmother   . Heart attack Paternal Grandmother   . CVA Paternal Grandmother   . CVA Paternal Grandfather   . Heart attack Paternal Grandfather   . Colon cancer Neg Hx     Social History Social History   Tobacco Use  . Smoking status: Never Smoker  . Smokeless tobacco: Never Used  Substance Use Topics  . Alcohol use: Yes    Alcohol/week: 0.0 - 1.0 standard drinks    Comment: OCC WINE  . Drug use: No    Allergies  Allergen Reactions  . Actonel [Risedronate Sodium] Other (See Comments)    GI side effects  . Alendronate Other (See Comments)    GI side effects  . Clarithromycin  Other (See Comments)    Unknown  . Erythromycin Other (See Comments)    Jaundice;  CAN TAKE MACROBID PER PATIENT  . Sulfa Antibiotics Other (See Comments)    Unknown  . Bactrim [Sulfamethoxazole-Trimethoprim] Rash    Current Outpatient Medications  Medication Sig Dispense Refill  . acetaminophen (TYLENOL) 500 MG tablet Take 500 mg by mouth every 6 (six) hours as needed for moderate pain or headache.     . ALPRAZolam (XANAX) 0.5 MG tablet Take 1 tablet (0.5 mg total) by mouth 2 (two) times daily as needed for anxiety. 90 tablet 0  . CALCIUM-VITAMIN D PO Take 1 tablet by mouth daily.     . cetirizine (ZYRTEC ALLERGY) 10 MG tablet Take 10 mg by mouth daily as needed for allergies.     Marland Kitchen omeprazole (PRILOSEC) 20 MG capsule TAKE 1 TO 2 CAPSULES BY MOUTH EVERY DAY (Patient taking differently: Take 20 mg by mouth every evening. ) 180 capsule 0  . Polyethyl Glycol-Propyl Glycol (SYSTANE) 0.4-0.3 % SOLN Place 1-2 drops into both eyes 3 (three) times daily as needed (for dry eyes).    Marland Kitchen zolpidem (AMBIEN) 10  MG tablet TAKE 1 TABLET(10 MG) BY MOUTH AT BEDTIME AS NEEDED (Patient taking differently: Take 5 mg by mouth at bedtime. ) 30 tablet 4   No current facility-administered medications for this visit.     Review of Systems Review of Systems  Constitutional: Negative.   Respiratory: Negative.   Cardiovascular: Negative.     Blood pressure (!) 142/80, pulse 70, resp. rate 16, height 5\' 2"  (1.575 m), weight 129 lb (58.5 kg), SpO2 99 %.  Physical Exam Physical Exam  Constitutional: She is oriented to person, place, and time. She appears well-developed and well-nourished.  HENT:  Mouth/Throat: Oropharynx is clear and moist.  Eyes: No scleral icterus.  Neck: Neck supple.  Pulmonary/Chest:  Small area at end of incision, stitch irritation    Neurological: She is alert and oriented to person, place, and time.  Skin: Skin is warm and dry.  Psychiatric: Her behavior is normal.    Data  Reviewed ER PR negative tumor.  Assessment   Doing well post wide excision.    Plan    Follow up in 4-5 weeks     HPI, Physical Exam, Assessment and Plan have been scribed under the direction and in the presence of Robert Bellow, MD. Karie Fetch, RN  I have completed the exam and reviewed the above documentation for accuracy and completeness.  I agree with the above.  Haematologist has been used and any errors in dictation or transcription are unintentional.  Hervey Ard, M.D., F.A.C.S.  Forest Gleason Samanthan Dugo 03/18/2018, 8:55 PM

## 2018-03-17 NOTE — Patient Instructions (Addendum)
The patient is aware to call back for any questions or new concerns.  

## 2018-03-18 ENCOUNTER — Ambulatory Visit
Admission: RE | Admit: 2018-03-18 | Discharge: 2018-03-18 | Disposition: A | Discharge status: Home / Self Care | Payer: Medicare Other | Source: Ambulatory Visit | Attending: Radiation Oncology | Admitting: Radiation Oncology

## 2018-03-18 ENCOUNTER — Encounter: Payer: Self-pay | Admitting: General Surgery

## 2018-03-18 DIAGNOSIS — Z510 Encounter for antineoplastic radiation therapy: Secondary | ICD-10-CM | POA: Diagnosis not present

## 2018-03-19 ENCOUNTER — Ambulatory Visit
Admission: RE | Admit: 2018-03-19 | Discharge: 2018-03-19 | Disposition: A | Discharge status: Home / Self Care | Payer: Medicare Other | Source: Ambulatory Visit | Attending: Radiation Oncology | Admitting: Radiation Oncology

## 2018-03-19 DIAGNOSIS — Z510 Encounter for antineoplastic radiation therapy: Secondary | ICD-10-CM | POA: Diagnosis not present

## 2018-03-20 ENCOUNTER — Ambulatory Visit
Admission: RE | Admit: 2018-03-20 | Discharge: 2018-03-20 | Disposition: A | Discharge status: Home / Self Care | Payer: Medicare Other | Source: Ambulatory Visit | Attending: Radiation Oncology | Admitting: Radiation Oncology

## 2018-03-20 DIAGNOSIS — Z510 Encounter for antineoplastic radiation therapy: Secondary | ICD-10-CM | POA: Diagnosis not present

## 2018-03-23 ENCOUNTER — Ambulatory Visit
Admission: RE | Admit: 2018-03-23 | Discharge: 2018-03-23 | Disposition: A | Discharge status: Home / Self Care | Payer: Medicare Other | Source: Ambulatory Visit | Attending: Radiation Oncology | Admitting: Radiation Oncology

## 2018-03-23 ENCOUNTER — Other Ambulatory Visit: Payer: Self-pay

## 2018-03-23 ENCOUNTER — Inpatient Hospital Stay: Payer: Medicare Other | Attending: Radiation Oncology

## 2018-03-23 DIAGNOSIS — D0511 Intraductal carcinoma in situ of right breast: Secondary | ICD-10-CM | POA: Diagnosis not present

## 2018-03-23 DIAGNOSIS — Z510 Encounter for antineoplastic radiation therapy: Secondary | ICD-10-CM | POA: Diagnosis not present

## 2018-03-23 LAB — CBC
HCT: 34.5 % — ABNORMAL LOW (ref 36.0–46.0)
Hemoglobin: 10.8 g/dL — ABNORMAL LOW (ref 12.0–15.0)
MCH: 28.8 pg (ref 26.0–34.0)
MCHC: 31.3 g/dL (ref 30.0–36.0)
MCV: 92 fL (ref 80.0–100.0)
PLATELETS: 201 10*3/uL (ref 150–400)
RBC: 3.75 MIL/uL — ABNORMAL LOW (ref 3.87–5.11)
RDW: 15.9 % — AB (ref 11.5–15.5)
WBC: 3.4 10*3/uL — ABNORMAL LOW (ref 4.0–10.5)
nRBC: 0 % (ref 0.0–0.2)

## 2018-03-24 ENCOUNTER — Ambulatory Visit
Admission: RE | Admit: 2018-03-24 | Discharge: 2018-03-24 | Disposition: A | Discharge status: Home / Self Care | Payer: Medicare Other | Source: Ambulatory Visit | Attending: Radiation Oncology | Admitting: Radiation Oncology

## 2018-03-24 DIAGNOSIS — Z510 Encounter for antineoplastic radiation therapy: Secondary | ICD-10-CM | POA: Diagnosis not present

## 2018-03-25 ENCOUNTER — Ambulatory Visit
Admission: RE | Admit: 2018-03-25 | Discharge: 2018-03-25 | Disposition: A | Discharge status: Home / Self Care | Payer: Medicare Other | Source: Ambulatory Visit | Attending: Radiation Oncology | Admitting: Radiation Oncology

## 2018-03-25 DIAGNOSIS — Z510 Encounter for antineoplastic radiation therapy: Secondary | ICD-10-CM | POA: Diagnosis not present

## 2018-03-30 ENCOUNTER — Ambulatory Visit
Admission: RE | Admit: 2018-03-30 | Discharge: 2018-03-30 | Disposition: A | Discharge status: Home / Self Care | Payer: Medicare Other | Source: Ambulatory Visit | Attending: Radiation Oncology | Admitting: Radiation Oncology

## 2018-03-30 DIAGNOSIS — Z510 Encounter for antineoplastic radiation therapy: Secondary | ICD-10-CM | POA: Insufficient documentation

## 2018-03-30 DIAGNOSIS — D0511 Intraductal carcinoma in situ of right breast: Secondary | ICD-10-CM | POA: Insufficient documentation

## 2018-03-31 ENCOUNTER — Ambulatory Visit
Admission: RE | Admit: 2018-03-31 | Discharge: 2018-03-31 | Disposition: A | Discharge status: Home / Self Care | Payer: Medicare Other | Source: Ambulatory Visit | Attending: Radiation Oncology | Admitting: Radiation Oncology

## 2018-03-31 DIAGNOSIS — Z510 Encounter for antineoplastic radiation therapy: Secondary | ICD-10-CM | POA: Diagnosis not present

## 2018-04-01 ENCOUNTER — Ambulatory Visit
Admission: RE | Admit: 2018-04-01 | Discharge: 2018-04-01 | Disposition: A | Discharge status: Home / Self Care | Payer: Medicare Other | Source: Ambulatory Visit | Attending: Radiation Oncology | Admitting: Radiation Oncology

## 2018-04-01 DIAGNOSIS — Z510 Encounter for antineoplastic radiation therapy: Secondary | ICD-10-CM | POA: Diagnosis not present

## 2018-04-02 ENCOUNTER — Ambulatory Visit
Admission: RE | Admit: 2018-04-02 | Discharge: 2018-04-02 | Disposition: A | Discharge status: Home / Self Care | Payer: Medicare Other | Source: Ambulatory Visit | Attending: Radiation Oncology | Admitting: Radiation Oncology

## 2018-04-02 DIAGNOSIS — Z510 Encounter for antineoplastic radiation therapy: Secondary | ICD-10-CM | POA: Diagnosis not present

## 2018-04-03 ENCOUNTER — Ambulatory Visit
Admission: RE | Admit: 2018-04-03 | Discharge: 2018-04-03 | Disposition: A | Discharge status: Home / Self Care | Payer: Medicare Other | Source: Ambulatory Visit | Attending: Radiation Oncology | Admitting: Radiation Oncology

## 2018-04-03 DIAGNOSIS — Z510 Encounter for antineoplastic radiation therapy: Secondary | ICD-10-CM | POA: Diagnosis not present

## 2018-04-05 ENCOUNTER — Other Ambulatory Visit: Payer: Self-pay | Admitting: Family Medicine

## 2018-04-06 ENCOUNTER — Inpatient Hospital Stay: Payer: Medicare Other | Attending: Radiation Oncology

## 2018-04-06 ENCOUNTER — Ambulatory Visit
Admission: RE | Admit: 2018-04-06 | Discharge: 2018-04-06 | Disposition: A | Discharge status: Home / Self Care | Payer: Medicare Other | Source: Ambulatory Visit | Attending: Radiation Oncology | Admitting: Radiation Oncology

## 2018-04-06 ENCOUNTER — Other Ambulatory Visit: Payer: Self-pay

## 2018-04-06 DIAGNOSIS — D0511 Intraductal carcinoma in situ of right breast: Secondary | ICD-10-CM

## 2018-04-06 DIAGNOSIS — Z510 Encounter for antineoplastic radiation therapy: Secondary | ICD-10-CM | POA: Diagnosis not present

## 2018-04-06 LAB — CBC
HEMATOCRIT: 35.9 % — AB (ref 36.0–46.0)
HEMOGLOBIN: 11.2 g/dL — AB (ref 12.0–15.0)
MCH: 29 pg (ref 26.0–34.0)
MCHC: 31.2 g/dL (ref 30.0–36.0)
MCV: 93 fL (ref 80.0–100.0)
Platelets: 194 10*3/uL (ref 150–400)
RBC: 3.86 MIL/uL — ABNORMAL LOW (ref 3.87–5.11)
RDW: 16 % — AB (ref 11.5–15.5)
WBC: 3.1 10*3/uL — ABNORMAL LOW (ref 4.0–10.5)
nRBC: 0 % (ref 0.0–0.2)

## 2018-04-07 ENCOUNTER — Ambulatory Visit
Admission: RE | Admit: 2018-04-07 | Discharge: 2018-04-07 | Disposition: A | Discharge status: Home / Self Care | Payer: Medicare Other | Source: Ambulatory Visit | Attending: Radiation Oncology | Admitting: Radiation Oncology

## 2018-04-07 DIAGNOSIS — Z510 Encounter for antineoplastic radiation therapy: Secondary | ICD-10-CM | POA: Diagnosis not present

## 2018-04-08 ENCOUNTER — Ambulatory Visit
Admission: RE | Admit: 2018-04-08 | Discharge: 2018-04-08 | Disposition: A | Discharge status: Home / Self Care | Payer: Medicare Other | Source: Ambulatory Visit | Attending: Radiation Oncology | Admitting: Radiation Oncology

## 2018-04-08 DIAGNOSIS — Z510 Encounter for antineoplastic radiation therapy: Secondary | ICD-10-CM | POA: Diagnosis not present

## 2018-04-09 ENCOUNTER — Ambulatory Visit
Admission: RE | Admit: 2018-04-09 | Discharge: 2018-04-09 | Disposition: A | Discharge status: Home / Self Care | Payer: Medicare Other | Source: Ambulatory Visit | Attending: Radiation Oncology | Admitting: Radiation Oncology

## 2018-04-09 DIAGNOSIS — Z510 Encounter for antineoplastic radiation therapy: Secondary | ICD-10-CM | POA: Diagnosis not present

## 2018-04-10 ENCOUNTER — Ambulatory Visit
Admission: RE | Admit: 2018-04-10 | Discharge: 2018-04-10 | Disposition: A | Discharge status: Home / Self Care | Payer: Medicare Other | Source: Ambulatory Visit | Attending: Radiation Oncology | Admitting: Radiation Oncology

## 2018-04-10 DIAGNOSIS — Z510 Encounter for antineoplastic radiation therapy: Secondary | ICD-10-CM | POA: Diagnosis not present

## 2018-04-13 ENCOUNTER — Ambulatory Visit
Admission: RE | Admit: 2018-04-13 | Discharge: 2018-04-13 | Disposition: A | Discharge status: Home / Self Care | Payer: Medicare Other | Source: Ambulatory Visit | Attending: Radiation Oncology | Admitting: Radiation Oncology

## 2018-04-13 DIAGNOSIS — Z510 Encounter for antineoplastic radiation therapy: Secondary | ICD-10-CM | POA: Diagnosis not present

## 2018-04-14 ENCOUNTER — Ambulatory Visit
Admission: RE | Admit: 2018-04-14 | Discharge: 2018-04-14 | Disposition: A | Discharge status: Home / Self Care | Payer: Medicare Other | Source: Ambulatory Visit | Attending: Radiation Oncology | Admitting: Radiation Oncology

## 2018-04-14 DIAGNOSIS — Z510 Encounter for antineoplastic radiation therapy: Secondary | ICD-10-CM | POA: Diagnosis not present

## 2018-04-16 ENCOUNTER — Other Ambulatory Visit: Payer: Self-pay

## 2018-04-16 ENCOUNTER — Ambulatory Visit (INDEPENDENT_AMBULATORY_CARE_PROVIDER_SITE_OTHER): Payer: Medicare Other | Admitting: General Surgery

## 2018-04-16 ENCOUNTER — Encounter: Payer: Self-pay | Admitting: General Surgery

## 2018-04-16 VITALS — BP 149/88 | HR 87 | Temp 97.9°F | Resp 14 | Ht 62.0 in | Wt 129.0 lb

## 2018-04-16 DIAGNOSIS — D0511 Intraductal carcinoma in situ of right breast: Secondary | ICD-10-CM

## 2018-04-16 NOTE — Progress Notes (Signed)
Patient ID: Sherri Cisneros, female   DOB: 1945-10-07, 72 y.o.   MRN: [id number]  Chief Complaint  Patient presents with  . Follow-up    HPI Sherri Cisneros is a 72 y.o. female here today for her follow up right breast wide excision done on 02/06/2018. She states that she doing well.  She finished her radiation therapy Tuesday, 04/14/18.  HPI  Past Medical History:  Diagnosis Date  . Anxiety   . Arthritis   . Cancer ([facility])    DCIS, high-grade.  ER/PR negative  . Colon polyp 2018  . GERD (gastroesophageal reflux disease)   . Hematuria   . Plantar fasciitis 2016, 2019   right foot    Past Surgical History:  Procedure Laterality Date  . BREAST BIOPSY Right 01/21/2018   affirm bx ribbon clip DUCTAL CARCINOMA IN SITU, HIGH-GRADE COMEDO TYPE    . BREAST EXCISIONAL BIOPSY Right 1998   negative, Dr Bary Castilla  . BREAST LUMPECTOMY Right 02/06/2018   Procedure: BREAST WIDE EXCISION;  Surgeon: Robert Bellow, MD;  Location: [facility] OR[facility];  Service: General;  Laterality: Right;  . COLONOSCOPY  2018   Dr Vira Agar  . HAND SURGERY Right   . LYMPH NODE DISSECTION     removed from back oh neck  . NASAL SINUS SURGERY    . TUBAL LIGATION      Family History  Problem Relation Age of Onset  . Hypertension Mother   . CVA Mother   . Thyroid disease Mother   . Seizures Mother   . Stroke Father   . Heart attack Father   . Alcohol abuse Sister   . Breast cancer Maternal Grandmother   . Heart attack Paternal Grandmother   . CVA Paternal Grandmother   . CVA Paternal Grandfather   . Heart attack Paternal Grandfather   . Colon cancer Neg Hx     Social History Social History   Tobacco Use  . Smoking status: Never Smoker  . Smokeless tobacco: Never Used  Substance Use Topics  . Alcohol use: Yes    Alcohol/week: 0.0 - 1.0 standard drinks    Comment: OCC WINE  . Drug use: No    Allergies  Allergen Reactions  . Actonel [Risedronate Sodium] Other (See Comments)    GI side effects  .  Alendronate Other (See Comments)    GI side effects  . Clarithromycin Other (See Comments)    Unknown  . Erythromycin Other (See Comments)    Jaundice;  CAN TAKE MACROBID PER PATIENT  . Sulfa Antibiotics Other (See Comments)    Unknown  . Bactrim [Sulfamethoxazole-Trimethoprim] Rash    Current Outpatient Medications  Medication Sig Dispense Refill  . acetaminophen (TYLENOL) 500 MG tablet Take 500 mg by mouth every 6 (six) hours as needed for moderate pain or headache.     . ALPRAZolam (XANAX) 0.5 MG tablet TAKE 1 TABLET(0.5 MG) BY MOUTH TWICE DAILY AS NEEDED FOR ANXIETY 90 tablet 0  . CALCIUM-VITAMIN D PO Take 1 tablet by mouth daily.     . cetirizine (ZYRTEC ALLERGY) 10 MG tablet Take 10 mg by mouth daily as needed for allergies.     Marland Kitchen omeprazole (PRILOSEC) 20 MG capsule Take 1 capsule (20 mg total) by mouth 2 (two) times daily as needed. 180 capsule 0  . Polyethyl Glycol-Propyl Glycol (SYSTANE) 0.4-0.3 % SOLN Place 1-2 drops into both eyes 3 (three) times daily as needed (for dry eyes).    Marland Kitchen zolpidem (AMBIEN) 10 MG  tablet TAKE 1 TABLET(10 MG) BY MOUTH AT BEDTIME AS NEEDED (Patient taking differently: Take 5 mg by mouth at bedtime. ) 30 tablet 4   No current facility-administered medications for this visit.     Review of Systems Review of Systems  Constitutional: Negative.   Respiratory: Negative.   Cardiovascular: Negative.     Blood pressure (!) 149/88, pulse 87, temperature 97.9 F (36.6 C), resp. rate 14, height 5\' 2"  (1.575 m), weight 129 lb (58.5 kg), SpO2 98 %.  Physical Exam Physical Exam Constitutional:      Appearance: She is well-developed.  Eyes:     General: No scleral icterus.    Conjunctiva/sclera: Conjunctivae normal.  Neck:     Musculoskeletal: Neck supple.  Cardiovascular:     Rate and Rhythm: Normal rate and regular rhythm.     Heart sounds: Normal heart sounds.  Pulmonary:     Effort: Pulmonary effort is normal.     Breath sounds: Normal breath  sounds.  Chest:     Breasts:        Right: Skin change (radiation changes) present. No inverted nipple, mass, nipple discharge or tenderness.        Left: No inverted nipple, mass, nipple discharge, skin change or tenderness.    Lymphadenopathy:     Cervical: No cervical adenopathy.  Skin:    General: Skin is warm and dry.  Neurological:     Mental Status: She is alert and oriented to person, place, and time.     Data Reviewed BREAST BIOMARKER TESTS  Estrogen Receptor (ER) Status: NEGATIVE (less than 1%)    Internal control cells present in stain as expected   Progesterone Receptor (PgR) Status: NEGATIVE (less than 1%)    Internal control cells present; no immunoreactivity of either tumor  cells or internal controls.   Assessment    Doing well post excision of high-grade DCIS.  Hormone negative tumor.  No indication for antiestrogen therapy.    Plan  Follow up in 3 months for office visit  HPI, Physical Exam, Assessment and Plan have been scribed under the direction and in the presence of Robert Bellow, MD  Concepcion Living, LPN  I have completed the exam and reviewed the above documentation for accuracy and completeness.  I agree with the above.  Haematologist has been used and any errors in dictation or transcription are unintentional.  Hervey Ard, M.D., F.A.C.S.  Forest Gleason Karley Pho 04/16/2018, 2:45 PM

## 2018-04-16 NOTE — Patient Instructions (Signed)
May use skin or hand cream to the breast to keep tissue soft.  Follow up in March.

## 2018-05-20 ENCOUNTER — Encounter: Payer: Self-pay | Admitting: Radiation Oncology

## 2018-05-20 ENCOUNTER — Other Ambulatory Visit: Payer: Self-pay

## 2018-05-20 ENCOUNTER — Ambulatory Visit
Admission: RE | Admit: 2018-05-20 | Discharge: 2018-05-20 | Disposition: A | Discharge status: Home / Self Care | Payer: Medicare Other | Source: Ambulatory Visit | Attending: Radiation Oncology | Admitting: Radiation Oncology

## 2018-05-20 VITALS — BP 162/93 | HR 99 | Temp 96.7°F | Resp 18 | Wt 129.7 lb

## 2018-05-20 DIAGNOSIS — Z171 Estrogen receptor negative status [ER-]: Secondary | ICD-10-CM | POA: Insufficient documentation

## 2018-05-20 DIAGNOSIS — D0511 Intraductal carcinoma in situ of right breast: Secondary | ICD-10-CM | POA: Insufficient documentation

## 2018-05-20 DIAGNOSIS — Z923 Personal history of irradiation: Secondary | ICD-10-CM | POA: Insufficient documentation

## 2018-05-20 NOTE — Progress Notes (Signed)
Radiation Oncology Follow up Note  Name: Sherri Cisneros   Date:   05/20/2018 MRN:  [id number] DOB: Nov 10, 1945    This 73 y.o. female presents to the clinic today for one-month follow-up status post whole breast radiation to her right breast for high-grade ductal carcinoma in situ.  REFERRING PROVIDER: Jerrol Banana.,*  HPI: patient is a 73 year old female now out 1 month having completed whole breast radiation to her right breast for high-grade ductal carcinoma in situ.ER/PR negative. Seen today in routine follow-up she is doing well. She specifically denies breast tenderness cough or bone pain has some normal sensations consistent with post surgery. She is not on antiestrogen therapy based on the ER/PR negative negative status of her disease.  COMPLICATIONS OF TREATMENT: none  FOLLOW UP COMPLIANCE: keeps appointments   PHYSICAL EXAM:  BP (!) 162/93 (BP Location: Left Arm, Patient Position: Sitting)   Pulse 99   Temp (!) 96.7 F (35.9 C) (Tympanic)   Resp 18   Wt 129 lb 11.9 oz (58.8 kg)   BMI 23.73 kg/m  Lungs are clear to A&P cardiac examination essentially unremarkable with regular rate and rhythm. No dominant mass or nodularity is noted in either breast in 2 positions examined. Incision is well-healed. No axillary or supraclavicular adenopathy is appreciated. Cosmetic result is excellent.Well-developed well-nourished patient in NAD. HEENT reveals PERLA, EOMI, discs not visualized.  Oral cavity is clear. No oral mucosal lesions are identified. Neck is clear without evidence of cervical or supraclavicular adenopathy. Lungs are clear to A&P. Cardiac examination is essentially unremarkable with regular rate and rhythm without murmur rub or thrill. Abdomen is benign with no organomegaly or masses noted. Motor sensory and DTR levels are equal and symmetric in the upper and lower extremities. Cranial nerves II through XII are grossly intact. Proprioception is intact. No peripheral  adenopathy or edema is identified. No motor or sensory levels are noted. Crude visual fields are within normal range.  RADIOLOGY RESULTS: no current films for review  PLAN: present time she is doing well 1 month out from whole breast radiation on please were overall progress. I've asked to see her back in 4-5 months for follow-up. She is not on antiestrogen therapy. Patient knows to call at anytime with any concerns.  I would like to take this opportunity to thank you for allowing me to participate in the care of your patient.Noreene Filbert, MD

## 2018-07-07 ENCOUNTER — Other Ambulatory Visit: Payer: Self-pay | Admitting: Family Medicine

## 2018-07-16 ENCOUNTER — Encounter: Payer: Self-pay | Admitting: General Surgery

## 2018-07-16 ENCOUNTER — Other Ambulatory Visit: Payer: Self-pay

## 2018-07-16 ENCOUNTER — Ambulatory Visit: Payer: Medicare Other | Admitting: General Surgery

## 2018-07-16 VITALS — BP 136/87 | HR 78 | Temp 97.7°F | Resp 18 | Ht 62.0 in | Wt 127.0 lb

## 2018-07-16 DIAGNOSIS — D0511 Intraductal carcinoma in situ of right breast: Secondary | ICD-10-CM

## 2018-07-16 NOTE — Progress Notes (Signed)
Patient ID: Sherri Cisneros, female   DOB: 23-Jun-1945, 73 y.o.   MRN: [id number]  Chief Complaint  Patient presents with  . Follow-up    3 month breast cancer    HPI Sherri Cisneros is a 73 y.o. female. Here today for follow up right breast cancer. Patient states she has tenderness under right breast. She states she has some new moles that she hasn't notice before her surgery.  HPI  Past Medical History:  Diagnosis Date  . Anxiety   . Arthritis   . Cancer (HCC)    DCIS, high-grade.  ER/PR negative  . Colon polyp 2018  . GERD (gastroesophageal reflux disease)   . Hematuria   . Plantar fasciitis 2016, 2019   right foot    Past Surgical History:  Procedure Laterality Date  . BREAST BIOPSY Right 01/21/2018   affirm bx ribbon clip DUCTAL CARCINOMA IN SITU, HIGH-GRADE COMEDO TYPE    . BREAST EXCISIONAL BIOPSY Right 1998   negative, Dr Bary Castilla  . BREAST LUMPECTOMY Right 02/06/2018   Procedure: BREAST WIDE EXCISION;  Surgeon: Robert Bellow, MD;  Location: [facility] ORS;  Service: General;  Laterality: Right;  . COLONOSCOPY  2018   Dr Vira Agar  . HAND SURGERY Right   . LYMPH NODE DISSECTION     removed from back oh neck  . NASAL SINUS SURGERY    . TUBAL LIGATION      Family History  Problem Relation Age of Onset  . Hypertension Mother   . CVA Mother   . Thyroid disease Mother   . Seizures Mother   . Stroke Father   . Heart attack Father   . Alcohol abuse Sister   . Breast cancer Maternal Grandmother   . Heart attack Paternal Grandmother   . CVA Paternal Grandmother   . CVA Paternal Grandfather   . Heart attack Paternal Grandfather   . Colon cancer Neg Hx     Social History Social History   Tobacco Use  . Smoking status: Never Smoker  . Smokeless tobacco: Never Used  Substance Use Topics  . Alcohol use: Yes    Alcohol/week: 0.0 - 1.0 standard drinks    Comment: OCC WINE  . Drug use: No    Allergies  Allergen Reactions  . Actonel [Risedronate Sodium] Other (See  Comments)    GI side effects  . Alendronate Other (See Comments)    GI side effects  . Clarithromycin Other (See Comments)    Unknown  . Erythromycin Other (See Comments)    Jaundice;  CAN TAKE MACROBID PER PATIENT  . Sulfa Antibiotics Other (See Comments)    Unknown  . Bactrim [Sulfamethoxazole-Trimethoprim] Rash    Current Outpatient Medications  Medication Sig Dispense Refill  . acetaminophen (TYLENOL) 500 MG tablet Take 500 mg by mouth every 6 (six) hours as needed for moderate pain or headache.     . ALPRAZolam (XANAX) 0.5 MG tablet TAKE 1 TABLET(0.5 MG) BY MOUTH TWICE DAILY AS NEEDED FOR ANXIETY 60 tablet 3  . CALCIUM-VITAMIN D PO Take 1 tablet by mouth daily.     . cetirizine (ZYRTEC ALLERGY) 10 MG tablet Take 10 mg by mouth daily as needed for allergies.     Marland Kitchen omeprazole (PRILOSEC) 20 MG capsule Take 1 capsule (20 mg total) by mouth 2 (two) times daily as needed. 180 capsule 0  . Polyethyl Glycol-Propyl Glycol (SYSTANE) 0.4-0.3 % SOLN Place 1-2 drops into both eyes 3 (three) times daily as needed (for  dry eyes).    Marland Kitchen zolpidem (AMBIEN) 10 MG tablet TAKE 1 TABLET(10 MG) BY MOUTH AT BEDTIME AS NEEDED (Patient taking differently: Take 5 mg by mouth at bedtime. ) 30 tablet 4   No current facility-administered medications for this visit.     Review of Systems Review of Systems  Constitutional: Negative.   Respiratory: Negative.   Cardiovascular: Negative.     Blood pressure 136/87, pulse 78, temperature 97.7 F (36.5 C), temperature source Oral, resp. rate 18, height 5\' 2"  (1.575 m), weight 127 lb (57.6 kg), SpO2 100 %.  Physical Exam Physical Exam Constitutional:      Appearance: She is well-developed.  Eyes:     General: No scleral icterus.    Conjunctiva/sclera: Conjunctivae normal.  Neck:     Musculoskeletal: Normal range of motion.  Cardiovascular:     Rate and Rhythm: Normal rate and regular rhythm.     Heart sounds: Normal heart sounds.  Pulmonary:      Effort: Pulmonary effort is normal.     Breath sounds: Normal breath sounds.  Chest:    Lymphadenopathy:     Cervical: No cervical adenopathy.     Upper Body:     Right upper body: No supraclavicular or axillary adenopathy.     Left upper body: No supraclavicular or axillary adenopathy.  Skin:    General: Skin is warm and dry.  Neurological:     Mental Status: She is alert and oriented to person, place, and time.     Data Reviewed No new data.  Assessment Doing well post wide excision and whole breast radiation.  Plan I believe the focal discomfort at the inframammary fold and lateral border of the breast are likely related to mild changes surgery mobilization of tissue and radiation.  This should resolve over time.  We will schedule a bilateral diagnostic mammogram in 6 months.  HPI, Physical Exam, Assessment and Plan have been scribed under the direction and in the presence of Robert Bellow, MD. Jonnie Finner, CMA  I have completed the exam and reviewed the above documentation for accuracy and completeness.  I agree with the above.  Haematologist has been used and any errors in dictation or transcription are unintentional.  Hervey Ard, M.D., F.A.C.S.  Forest Gleason Dinita Migliaccio 07/16/2018, 10:37 AM

## 2018-07-16 NOTE — Patient Instructions (Signed)
We will schedule a bilateral diagnostic mammogram in 6 months.

## 2018-07-17 ENCOUNTER — Other Ambulatory Visit: Payer: Self-pay | Admitting: Family Medicine

## 2018-09-08 ENCOUNTER — Other Ambulatory Visit: Payer: Self-pay | Admitting: Family Medicine

## 2018-09-09 NOTE — Telephone Encounter (Signed)
Please Review

## 2018-09-22 ENCOUNTER — Ambulatory Visit: Admit: 2018-09-22 | Payer: Medicare Other | Admitting: Ophthalmology

## 2018-09-22 SURGERY — PHACOEMULSIFICATION, CATARACT, WITH IOL INSERTION
Anesthesia: Choice | Laterality: Left

## 2018-10-19 ENCOUNTER — Other Ambulatory Visit: Payer: Self-pay

## 2018-10-19 ENCOUNTER — Ambulatory Visit (INDEPENDENT_AMBULATORY_CARE_PROVIDER_SITE_OTHER): Payer: Medicare Other | Admitting: Family Medicine

## 2018-10-19 DIAGNOSIS — F5101 Primary insomnia: Secondary | ICD-10-CM

## 2018-10-19 DIAGNOSIS — Z0000 Encounter for general adult medical examination without abnormal findings: Secondary | ICD-10-CM

## 2018-10-19 DIAGNOSIS — E7800 Pure hypercholesterolemia, unspecified: Secondary | ICD-10-CM | POA: Diagnosis not present

## 2018-10-19 DIAGNOSIS — K219 Gastro-esophageal reflux disease without esophagitis: Secondary | ICD-10-CM | POA: Diagnosis not present

## 2018-10-19 DIAGNOSIS — F419 Anxiety disorder, unspecified: Secondary | ICD-10-CM

## 2018-10-19 NOTE — Progress Notes (Signed)
[name], Female    DOB: 1945/09/16, 73 y.o.   MRN: [id number] Visit Date: 10/19/2018  Today's Provider: Wilhemena Durie, MD   Chief Complaint  Patient presents with  . Medicare Wellness   Subjective:     Annual wellness visit Sherri Cisneros is a 73 y.o. female. She feels well. She reports exercising walking. She reports she is sleeping well. She feels well.  -----------------------------------------------------------   Review of Systems  Constitutional: Negative.   HENT: Positive for congestion, postnasal drip, rhinorrhea and sinus pressure.   Eyes: Negative.   Respiratory: Negative.   Cardiovascular: Negative.   Gastrointestinal: Negative.   Endocrine: Negative.   Genitourinary: Negative.   Musculoskeletal: Positive for back pain.  Skin: Negative.   Allergic/Immunologic: Positive for environmental allergies.  Neurological: Negative.   Hematological: Negative.   Psychiatric/Behavioral: Negative.     Social History   Socioeconomic History  . Marital status: Married    Spouse name: Not on file  . Number of children: 1  . Years of education: Not on file  . Highest education level: Not on file  Occupational History  . Not on file  Social Needs  . Financial resource strain: Not on file  . Food insecurity    Worry: Not on file    Inability: Not on file  . Transportation needs    Medical: Not on file    Non-medical: Not on file  Tobacco Use  . Smoking status: Never Smoker  . Smokeless tobacco: Never Used  Substance and Sexual Activity  . Alcohol use: Yes    Alcohol/week: 0.0 - 1.0 standard drinks    Comment: OCC WINE  . Drug use: No  . Sexual activity: Not on file  Lifestyle  . Physical activity    Days per week: Not on file    Minutes per session: Not on file  . Stress: Not on file  Relationships  . Social Herbalist on phone: Not on file    Gets together: Not on file    Attends religious service: Not on file     Active member of club or organization: Not on file    Attends meetings of clubs or organizations: Not on file    Relationship status: Not on file  . Intimate partner violence    Fear of current or ex partner: Not on file    Emotionally abused: Not on file    Physically abused: Not on file    Forced sexual activity: Not on file  Other Topics Concern  . Not on file  Social History Narrative  . Not on file    Past Medical History:  Diagnosis Date  . Anxiety   . Arthritis   . Cancer (HCC)    DCIS, high-grade.  ER/PR negative  . Colon polyp 2018  . GERD (gastroesophageal reflux disease)   . Hematuria   . Plantar fasciitis 2016, 2019   right foot     Patient Active Problem List   Diagnosis Date Noted  . Current tear knee, medial meniscus 02/12/2018  . Knee pain 02/12/2018  . Ductal carcinoma in situ (DCIS) of right breast 01/28/2018  . Plantar fasciitis, right 05/07/2017  . Bloating 05/03/2016  . Dysphagia 05/03/2016  . Epigastric pain 05/03/2016  . GERD (gastroesophageal reflux disease) 05/03/2016  . Insomnia 02/22/2015  . Anxiety 10/24/2014  . Cervical muscle strain 10/24/2014  . Elevated TSH 10/24/2014  . Blood in the  urine 10/24/2014  . Hypercholesteremia 10/24/2014  . Major depressive disorder, single episode 10/24/2014  . Migraine 10/24/2014  . Muscle ache 10/24/2014  . FOM (frequency of micturition) 10/24/2014  . Raynaud's syndrome 10/24/2014  . Arthritis, degenerative 10/24/2014  . Atypical nevus 10/24/2014  . Incomplete emptying of bladder 06/03/2012  . Mixed urge and stress incontinence 06/03/2012  . Dysuria 06/01/2012  . Functional disorder of bladder 06/01/2012    Past Surgical History:  Procedure Laterality Date  . BREAST BIOPSY Right 01/21/2018   affirm bx ribbon clip DUCTAL CARCINOMA IN SITU, HIGH-GRADE COMEDO TYPE    . BREAST EXCISIONAL BIOPSY Right 1998   negative, Dr Bary Castilla  . BREAST LUMPECTOMY Right 02/06/2018   Procedure: BREAST WIDE  EXCISION;  Surgeon: Robert Bellow, MD;  Location: [facility] ORS;  Service: General;  Laterality: Right;  . COLONOSCOPY  2018   Dr Vira Agar  . HAND SURGERY Right   . LYMPH NODE DISSECTION     removed from back oh neck  . NASAL SINUS SURGERY    . TUBAL LIGATION      Her family history includes Alcohol abuse in her sister; Breast cancer in her maternal grandmother; CVA in her mother, paternal grandfather, and paternal grandmother; Heart attack in her father, paternal grandfather, and paternal grandmother; Hypertension in her mother; Seizures in her mother; Stroke in her father; Thyroid disease in her mother. There is no history of Colon cancer.   Current Outpatient Medications:  .  acetaminophen (TYLENOL) 500 MG tablet, Take 500 mg by mouth every 6 (six) hours as needed for moderate pain or headache. , Disp: , Rfl:  .  ALPRAZolam (XANAX) 0.5 MG tablet, TAKE 1 TABLET(0.5 MG) BY MOUTH TWICE DAILY AS NEEDED FOR ANXIETY, Disp: 60 tablet, Rfl: 3 .  CALCIUM-VITAMIN D PO, Take 1 tablet by mouth daily. , Disp: , Rfl:  .  cetirizine (ZYRTEC ALLERGY) 10 MG tablet, Take 10 mg by mouth daily as needed for allergies. , Disp: , Rfl:  .  omeprazole (PRILOSEC) 20 MG capsule, TAKE 1 CAPSULE(20 MG) BY MOUTH TWICE DAILY AS NEEDED, Disp: 180 capsule, Rfl: 0 .  zolpidem (AMBIEN) 10 MG tablet, Take 0.5 tablets (5 mg total) by mouth at bedtime., Disp: 30 tablet, Rfl: 3 .  Polyethyl Glycol-Propyl Glycol (SYSTANE) 0.4-0.3 % SOLN, Place 1-2 drops into both eyes 3 (three) times daily as needed (for dry eyes)., Disp: , Rfl:   Patient Care Team: Jerrol Banana., MD as PCP - General (Family Medicine) Arelia Sneddon, OD as Consulting Physician (Optometry) Romeo, Romilda Garret, DPM as Consulting Physician (Podiatry) Jannet Mantis, MD as Consulting Physician (Dermatology)    Objective:    Vitals: There were no vitals taken for this visit.  Physical Exam Vitals signs reviewed.  Constitutional:       Appearance: She is well-developed.  HENT:     Head: Normocephalic and atraumatic.     Right Ear: External ear normal.     Left Ear: External ear normal.     Nose: Nose normal.  Eyes:     Conjunctiva/sclera: Conjunctivae normal.     Pupils: Pupils are equal, round, and reactive to light.  Neck:     Musculoskeletal: Normal range of motion and neck supple.  Cardiovascular:     Rate and Rhythm: Normal rate and regular rhythm.     Heart sounds: Normal heart sounds.  Pulmonary:     Effort: Pulmonary effort is normal.     Breath sounds: Normal breath  sounds.  Abdominal:     General: Bowel sounds are normal.     Palpations: Abdomen is soft.  Genitourinary:    General: Normal vulva.     Vagina: No vaginal discharge.     Rectum: Normal. Guaiac result negative.     Comments: External genitalia normal.Bimanual exam benign. DRE normal. Musculoskeletal: Normal range of motion.  Skin:    General: Skin is warm and dry.  Neurological:     Mental Status: She is alert and oriented to person, place, and time.  Psychiatric:        Behavior: Behavior normal.        Thought Content: Thought content normal.        Judgment: Judgment normal.     Activities of Daily Living In your present state of health, do you have any difficulty performing the following activities: 10/19/2018 02/06/2018  Hearing? N N  Vision? Y N  Comment Cataract surgery 11/10/2018 per pt. -  Difficulty concentrating or making decisions? N N  Walking or climbing stairs? N N  Dressing or bathing? N N  Doing errands, shopping? N -  Some recent data might be hidden    Fall Risk Assessment Fall Risk  10/19/2018 07/16/2018 03/17/2018 12/31/2016 02/29/2016  Falls in the past year? 0 0 0 No No  Number falls in past yr: 0 - - - -  Injury with Fall? 0 - - - -  Follow up - - Falls evaluation completed - -     Depression Screen PHQ 2/9 Scores 10/19/2018 12/31/2016 02/29/2016 01/04/2015  PHQ - 2 Score 1 1 1 1   PHQ- 9 Score 3 - - -     6CIT Screen 02/29/2016  What Year? 0 points  What month? 0 points  What time? 0 points  Count back from 20 0 points  Months in reverse 0 points  Repeat phrase 0 points  Total Score 0      Assessment & Plan:     Annual Wellness Visit  Reviewed patient's Family Medical History Reviewed and updated list of patient's medical providers Assessment of cognitive impairment was done Assessed patient's functional ability Established a written schedule for health screening [place] Completed and Reviewed  Exercise Activities and Dietary recommendations Goals    . Increase water intake     Starting 02/29/16, I will increase my water intake to 6 glasses a day.       Immunization History  Administered Date(s) Administered  . Influenza, High Dose Seasonal PF 01/04/2015, 02/29/2016, 12/31/2016, 01/24/2018  . Pneumococcal Conjugate-13 01/04/2015  . Pneumococcal Polysaccharide-23 01/21/2012  . Td 08/17/2003, 12/21/2010  . Tdap 07/02/2011, 03/07/2014  . Zoster 08/12/2011    Health Maintenance  Topic Date Due  . INFLUENZA VACCINE  11/28/2018  . MAMMOGRAM  01/17/2020  . COLONOSCOPY  07/11/2021  . TETANUS/TDAP  03/07/2024  . DEXA SCAN  Completed  . Hepatitis C Screening  Completed  . PNA vac Low Risk Adult  Completed     Discussed health benefits of physical activity, and encouraged her to engage in regular exercise appropriate for her age and condition.  1. Annual physical exam Colonoscopy 2028.  2. Hypercholesteremia  - Comprehensive metabolic panel - Lipid panel - TSH  3. Gastroesophageal reflux disease, esophagitis presence not specified Omeprazole. - CBC with Differential/Platelet  4. Primary insomnia Advised to cut back on Ambien.  5. Anxiety Improved.    ------------------------------------------------------------------------------------------------------------   I have done the exam and reviewed the above chart  and it is  accurate to the best of my knowledge. Development worker, community has been used in this note in any air is in the dictation or transcription are unintentional.  Wilhemena Durie, MD  [place]

## 2018-10-20 ENCOUNTER — Other Ambulatory Visit: Payer: Self-pay

## 2018-10-20 ENCOUNTER — Encounter: Payer: Self-pay | Admitting: Radiation Oncology

## 2018-10-20 ENCOUNTER — Ambulatory Visit
Admission: RE | Admit: 2018-10-20 | Discharge: 2018-10-20 | Disposition: A | Discharge status: Home / Self Care | Payer: Medicare Other | Source: Ambulatory Visit | Attending: Radiation Oncology | Admitting: Radiation Oncology

## 2018-10-20 VITALS — BP 142/89 | HR 100 | Temp 96.0°F | Resp 16 | Wt 129.0 lb

## 2018-10-20 DIAGNOSIS — Z171 Estrogen receptor negative status [ER-]: Secondary | ICD-10-CM | POA: Insufficient documentation

## 2018-10-20 DIAGNOSIS — D0511 Intraductal carcinoma in situ of right breast: Secondary | ICD-10-CM | POA: Diagnosis present

## 2018-10-20 DIAGNOSIS — L299 Pruritus, unspecified: Secondary | ICD-10-CM | POA: Insufficient documentation

## 2018-10-20 NOTE — Progress Notes (Signed)
Radiation Oncology Follow up Note  Name: Sherri Cisneros   Date:   10/20/2018 MRN:  [id number] DOB: March 08, 1946    This 73 y.o. female presents to the clinic today for 60-month follow-up status post whole breast radiation to her right breast for high-grade ductal carcinoma in situ ER PR negative.  REFERRING PROVIDER: Jerrol Banana.,*  HPI: Patient is a 73 year old female now seen at 6 months having completed whole breast radiation to her right breast for high-grade ER PR negative ductal carcinoma in situ seen today in routine follow-up she is doing well.  She specifically denies breast tenderness cough or bone pain.  She is not on antiestrogen therapy based on ER PR negative nature of her disease..  She is not yet had a follow-up mammogram.  Patient has been having some itching in the right inframammary fold of unknown etiology no skin reaction or obvious pathology is noted.  COMPLICATIONS OF TREATMENT: none  FOLLOW UP COMPLIANCE: keeps appointments   PHYSICAL EXAM:  BP (!) 142/89 (BP Location: Left Arm, Patient Position: Sitting)   Pulse 100   Temp (!) 96 F (35.6 C) (Tympanic)   Resp 16   Wt 128 lb 15.5 oz (58.5 kg)   BMI 23.59 kg/m  Lungs are clear to A&P cardiac examination essentially unremarkable with regular rate and rhythm. No dominant mass or nodularity is noted in either breast in 2 positions examined. Incision is well-healed. No axillary or supraclavicular adenopathy is appreciated. Cosmetic result is excellent.  Well-developed well-nourished patient in NAD. HEENT reveals PERLA, EOMI, discs not visualized.  Oral cavity is clear. No oral mucosal lesions are identified. Neck is clear without evidence of cervical or supraclavicular adenopathy. Lungs are clear to A&P. Cardiac examination is essentially unremarkable with regular rate and rhythm without murmur rub or thrill. Abdomen is benign with no organomegaly or masses noted. Motor sensory and DTR levels are equal and symmetric  in the upper and lower extremities. Cranial nerves II through XII are grossly intact. Proprioception is intact. No peripheral adenopathy or edema is identified. No motor or sensory levels are noted. Crude visual fields are within normal range.  RADIOLOGY RESULTS: No current films to review  PLAN: Present time patient is doing well.  I am going to start her on ketoconazole cream in the inframammary fold for 2 weeks as well as adding Benadryl gel for the itching.  She will be having mammograms ordered by Dr. Tollie Pizza in the near future.  I have asked to see her back in 6 months and then will start once a year follow-up visits.  Patient is not on antiestrogen therapy based on the ER PR negative nature of her disease.  Patient knows to call with any concerns.  I would like to take this opportunity to thank you for allowing me to participate in the care of your patient.Noreene Filbert, MD

## 2018-10-21 LAB — CBC WITH DIFFERENTIAL/PLATELET
Basophils Absolute: 0 10*3/uL (ref 0.0–0.2)
Basos: 1 %
EOS (ABSOLUTE): 0.1 10*3/uL (ref 0.0–0.4)
Eos: 2 %
Hematocrit: 33.6 % — ABNORMAL LOW (ref 34.0–46.6)
Hemoglobin: 11.1 g/dL (ref 11.1–15.9)
Immature Grans (Abs): 0 10*3/uL (ref 0.0–0.1)
Immature Granulocytes: 0 %
Lymphocytes Absolute: 2 10*3/uL (ref 0.7–3.1)
Lymphs: 56 %
MCH: 29.4 pg (ref 26.6–33.0)
MCHC: 33 g/dL (ref 31.5–35.7)
MCV: 89 fL (ref 79–97)
Monocytes Absolute: 0.3 10*3/uL (ref 0.1–0.9)
Monocytes: 9 %
Neutrophils Absolute: 1.1 10*3/uL — ABNORMAL LOW (ref 1.4–7.0)
Neutrophils: 32 %
Platelets: 223 10*3/uL (ref 150–450)
RBC: 3.77 x10E6/uL (ref 3.77–5.28)
RDW: 15.7 % — ABNORMAL HIGH (ref 11.7–15.4)
WBC: 3.5 10*3/uL (ref 3.4–10.8)

## 2018-10-21 LAB — LIPID PANEL
Chol/HDL Ratio: 3.8 ratio (ref 0.0–4.4)
Cholesterol, Total: 245 mg/dL — ABNORMAL HIGH (ref 100–199)
HDL: 64 mg/dL (ref >39)
LDL Calculated: 150 mg/dL — ABNORMAL HIGH (ref 0–99)
Triglycerides: 154 mg/dL — ABNORMAL HIGH (ref 0–149)
VLDL Cholesterol Cal: 31 mg/dL (ref 5–40)

## 2018-10-21 LAB — TSH: TSH: 3.67 u[IU]/mL (ref 0.450–4.500)

## 2018-10-21 LAB — COMPREHENSIVE METABOLIC PANEL
ALT: 10 IU/L (ref 0–32)
AST: 15 IU/L (ref 0–40)
Albumin/Globulin Ratio: 1.7 (ref 1.2–2.2)
Albumin: 4.1 g/dL (ref 3.7–4.7)
Alkaline Phosphatase: 57 IU/L (ref 39–117)
BUN/Creatinine Ratio: 23 (ref 12–28)
BUN: 18 mg/dL (ref 8–27)
Bilirubin Total: 0.4 mg/dL (ref 0.0–1.2)
CO2: 22 mmol/L (ref 20–29)
Calcium: 9.4 mg/dL (ref 8.7–10.3)
Chloride: 105 mmol/L (ref 96–106)
Creatinine, Ser: 0.79 mg/dL (ref 0.57–1.00)
GFR calc Af Amer: 87 mL/min/{1.73_m2} (ref >59)
GFR calc non Af Amer: 76 mL/min/{1.73_m2} (ref >59)
Globulin, Total: 2.4 g/dL (ref 1.5–4.5)
Glucose: 89 mg/dL (ref 65–99)
Potassium: 4.3 mmol/L (ref 3.5–5.2)
Sodium: 140 mmol/L (ref 134–144)
Total Protein: 6.5 g/dL (ref 6.0–8.5)

## 2018-10-22 ENCOUNTER — Telehealth: Payer: Self-pay

## 2018-10-22 NOTE — Telephone Encounter (Signed)
-----   Message from Jerrol Banana., MD sent at 10/22/2018  7:46 AM EDT ----- Labs stable.

## 2018-10-22 NOTE — Telephone Encounter (Signed)
[id number] 

## 2018-10-22 NOTE — Telephone Encounter (Signed)
Patient advised.KW 

## 2018-10-23 ENCOUNTER — Ambulatory Visit: Payer: Medicare Other | Admitting: Radiation Oncology

## 2018-11-01 ENCOUNTER — Other Ambulatory Visit: Payer: Self-pay | Admitting: Podiatry

## 2018-11-02 NOTE — Telephone Encounter (Signed)
No more refills until patient is seen

## 2018-11-03 ENCOUNTER — Other Ambulatory Visit: Payer: Self-pay

## 2018-11-03 ENCOUNTER — Encounter: Payer: Self-pay | Admitting: *Deleted

## 2018-11-03 NOTE — Discharge Instructions (Signed)

## 2018-11-06 ENCOUNTER — Other Ambulatory Visit
Admission: RE | Admit: 2018-11-06 | Discharge: 2018-11-06 | Disposition: A | Discharge status: Home / Self Care | Payer: Medicare Other | Source: Ambulatory Visit | Attending: Ophthalmology | Admitting: Ophthalmology

## 2018-11-06 ENCOUNTER — Other Ambulatory Visit: Payer: Self-pay

## 2018-11-06 DIAGNOSIS — Z1159 Encounter for screening for other viral diseases: Secondary | ICD-10-CM | POA: Insufficient documentation

## 2018-11-06 DIAGNOSIS — Z01812 Encounter for preprocedural laboratory examination: Secondary | ICD-10-CM | POA: Insufficient documentation

## 2018-11-06 LAB — SARS CORONAVIRUS 2 (TAT 6-24 HRS): SARS Coronavirus 2: NEGATIVE

## 2018-11-08 NOTE — Anesthesia Preprocedure Evaluation (Addendum)
Anesthesia Evaluation  Patient identified by MRN, date of birth, ID band Patient awake    Reviewed: Allergy & Precautions, NPO status , Patient's Chart, lab work & pertinent test results  History of Anesthesia Complications Negative for: history of anesthetic complications  Airway Mallampati: I   Neck ROM: Full    Dental  (+)  Crowns :   Pulmonary neg pulmonary ROS,    Pulmonary exam normal        Cardiovascular Exercise Tolerance: Good negative cardio ROS Normal cardiovascular exam Rhythm:Regular Rate:Normal     Neuro/Psych  Headaches, PSYCHIATRIC DISORDERS Anxiety Depression    GI/Hepatic GERD  ,  Endo/Other  negative endocrine ROS  Renal/GU negative Renal ROS     Musculoskeletal  (+) Arthritis , Raynaud's   Abdominal   Peds  Hematology Breast CA   Anesthesia Other Findings   Reproductive/Obstetrics                            Anesthesia Physical Anesthesia Plan  ASA: II  Anesthesia Plan: MAC   Post-op Pain Management:    Induction: Intravenous  PONV Risk Score and Plan: 2 and TIVA and Midazolam  Airway Management Planned: Natural Airway  Additional Equipment:   Intra-op Plan:   Post-operative Plan:   Informed Consent: I have reviewed the patients History and Physical, chart, labs and discussed the procedure including the risks, benefits and alternatives for the proposed anesthesia with the patient or authorized representative who has indicated his/her understanding and acceptance.       Plan Discussed with: CRNA  Anesthesia Plan Comments:        Anesthesia Quick Evaluation

## 2018-11-10 ENCOUNTER — Encounter
Admission: RE | Disposition: A | Discharge status: Home / Self Care | Payer: Self-pay | Source: Home / Self Care | Attending: Ophthalmology

## 2018-11-10 ENCOUNTER — Ambulatory Visit: Payer: Medicare Other | Admitting: Anesthesiology

## 2018-11-10 ENCOUNTER — Ambulatory Visit
Admission: RE | Admit: 2018-11-10 | Discharge: 2018-11-10 | Disposition: A | Discharge status: Home / Self Care | Payer: Medicare Other | Attending: Ophthalmology | Admitting: Ophthalmology

## 2018-11-10 ENCOUNTER — Other Ambulatory Visit: Payer: Self-pay

## 2018-11-10 DIAGNOSIS — F419 Anxiety disorder, unspecified: Secondary | ICD-10-CM | POA: Diagnosis not present

## 2018-11-10 DIAGNOSIS — F329 Major depressive disorder, single episode, unspecified: Secondary | ICD-10-CM | POA: Diagnosis not present

## 2018-11-10 DIAGNOSIS — H2512 Age-related nuclear cataract, left eye: Secondary | ICD-10-CM | POA: Diagnosis not present

## 2018-11-10 DIAGNOSIS — I7300 Raynaud's syndrome without gangrene: Secondary | ICD-10-CM | POA: Diagnosis not present

## 2018-11-10 DIAGNOSIS — Z79899 Other long term (current) drug therapy: Secondary | ICD-10-CM | POA: Diagnosis not present

## 2018-11-10 DIAGNOSIS — Z853 Personal history of malignant neoplasm of breast: Secondary | ICD-10-CM | POA: Diagnosis not present

## 2018-11-10 HISTORY — DX: Raynaud's syndrome without gangrene: I73.00

## 2018-11-10 HISTORY — DX: Headache, unspecified: R51.9

## 2018-11-10 HISTORY — DX: Dizziness and giddiness: R42

## 2018-11-10 HISTORY — PX: CATARACT EXTRACTION W/PHACO: SHX586

## 2018-11-10 SURGERY — PHACOEMULSIFICATION, CATARACT, WITH IOL INSERTION
Anesthesia: Monitor Anesthesia Care | Site: Eye | Laterality: Left

## 2018-11-10 MED ORDER — BRIMONIDINE TARTRATE-TIMOLOL 0.2-0.5 % OP SOLN
OPHTHALMIC | Status: DC | PRN
  Administered 2018-11-10: 1 [drp] via OPHTHALMIC

## 2018-11-10 MED ORDER — TETRACAINE HCL 0.5 % OP SOLN
1.0000 [drp] | OPHTHALMIC | Status: DC | PRN
  Administered 2018-11-10 (×3): 1 [drp] via OPHTHALMIC

## 2018-11-10 MED ORDER — NA CHONDROIT SULF-NA HYALURON 40-17 MG/ML IO SOLN
INTRAOCULAR | Status: DC | PRN
  Administered 2018-11-10: 1 mL via INTRAOCULAR

## 2018-11-10 MED ORDER — MOXIFLOXACIN HCL 0.5 % OP SOLN
OPHTHALMIC | Status: DC | PRN
  Administered 2018-11-10: 0.2 mL via OPHTHALMIC

## 2018-11-10 MED ORDER — EPINEPHRINE PF 1 MG/ML IJ SOLN
INTRAOCULAR | Status: DC | PRN
  Administered 2018-11-10: 71 mL via OPHTHALMIC

## 2018-11-10 MED ORDER — LIDOCAINE HCL (PF) 2 % IJ SOLN
INTRAOCULAR | Status: DC | PRN
  Administered 2018-11-10: 1 mL

## 2018-11-10 MED ORDER — ARMC OPHTHALMIC DILATING DROPS
1.0000 "application " | OPHTHALMIC | Status: DC | PRN
  Administered 2018-11-10 (×3): 1 via OPHTHALMIC

## 2018-11-10 MED ORDER — MIDAZOLAM HCL 2 MG/2ML IJ SOLN
INTRAMUSCULAR | Status: DC | PRN
  Administered 2018-11-10 (×2): 1 mg via INTRAVENOUS

## 2018-11-10 MED ORDER — FENTANYL CITRATE (PF) 100 MCG/2ML IJ SOLN
INTRAMUSCULAR | Status: DC | PRN
  Administered 2018-11-10 (×2): 50 ug via INTRAVENOUS

## 2018-11-10 SURGICAL SUPPLY — 19 items
CANNULA ANT/CHMB 27G (MISCELLANEOUS) ×1 IMPLANT
CANNULA ANT/CHMB 27GA (MISCELLANEOUS) ×3 IMPLANT
GLOVE SURG LX 8.0 MICRO (GLOVE) ×2
GLOVE SURG LX STRL 8.0 MICRO (GLOVE) ×1 IMPLANT
GLOVE SURG TRIUMPH 8.0 PF LTX (GLOVE) ×3 IMPLANT
GOWN STRL REUS W/ TWL LRG LVL3 (GOWN DISPOSABLE) ×2 IMPLANT
GOWN STRL REUS W/TWL LRG LVL3 (GOWN DISPOSABLE) ×6
LENS IOL TECNIS ITEC 21.0 (Intraocular Lens) ×2 IMPLANT
MARKER SKIN DUAL TIP RULER LAB (MISCELLANEOUS) ×3 IMPLANT
NDL FILTER BLUNT 18X1 1/2 (NEEDLE) ×1 IMPLANT
NDL RETROBULBAR .5 NSTRL (NEEDLE) ×3 IMPLANT
NEEDLE FILTER BLUNT 18X 1/2SAF (NEEDLE) ×2
NEEDLE FILTER BLUNT 18X1 1/2 (NEEDLE) ×1 IMPLANT
PACK EYE AFTER SURG (MISCELLANEOUS) ×3 IMPLANT
PACK OPTHALMIC (MISCELLANEOUS) ×3 IMPLANT
PACK PORFILIO (MISCELLANEOUS) ×3 IMPLANT
SYR 3ML LL SCALE MARK (SYRINGE) ×3 IMPLANT
SYR TB 1ML LUER SLIP (SYRINGE) ×3 IMPLANT
WIPE NON LINTING 3.25X3.25 (MISCELLANEOUS) ×3 IMPLANT

## 2018-11-10 NOTE — Transfer of Care (Signed)
Immediate Anesthesia Transfer of Care Note  [name]  Procedure(s) Performed: CATARACT EXTRACTION PHACO AND INTRAOCULAR LENS PLACEMENT (IOC)  LEFT (Left Eye)  Patient Location: PACU  Anesthesia Type: MAC  Level of Consciousness: awake, alert  and patient cooperative  Airway and Oxygen Therapy: Patient Spontanous Breathing and Patient connected to supplemental oxygen  Post-op Assessment: Post-op Vital signs reviewed, Patient's Cardiovascular Status Stable, Respiratory Function Stable, Patent Airway and No signs of Nausea or vomiting  Post-op Vital Signs: Reviewed and stable  Complications: No apparent anesthesia complications

## 2018-11-10 NOTE — H&P (Signed)
All labs reviewed. Abnormal studies sent to patients PCP when indicated.  Previous H&P reviewed, patient examined, there are NO CHANGES.  Sherri Topel Porfilio7/14/202010:05 AM

## 2018-11-10 NOTE — Op Note (Signed)
PREOPERATIVE DIAGNOSIS:  Nuclear sclerotic cataract of the left eye.   POSTOPERATIVE DIAGNOSIS:  Nuclear sclerotic cataract of the left eye.   OPERATIVE PROCEDURE: Procedure(s): CATARACT EXTRACTION PHACO AND INTRAOCULAR LENS PLACEMENT (IOC)  LEFT   SURGEON:  Birder Robson, MD.   ANESTHESIA:  Anesthesiologist: Darrin Nipper, MD CRNA: Izetta Dakin, CRNA  1.      Managed anesthesia care. 2.     0.56ml of Shugarcaine was instilled following the paracentesis   COMPLICATIONS:  None.   TECHNIQUE:   Stop and chop   DESCRIPTION OF PROCEDURE:  The patient was examined and consented in the preoperative holding area where the aforementioned topical anesthesia was applied to the left eye and then brought back to the Operating Room where the left eye was prepped and draped in the usual sterile ophthalmic fashion and a lid speculum was placed. A paracentesis was created with the side port blade and the anterior chamber was filled with viscoelastic. A near clear corneal incision was performed with the steel keratome. A continuous curvilinear capsulorrhexis was performed with a cystotome followed by the capsulorrhexis forceps. Hydrodissection and hydrodelineation were carried out with BSS on a blunt cannula. The lens was removed in a stop and chop  technique and the remaining cortical material was removed with the irrigation-aspiration handpiece. The capsular bag was inflated with viscoelastic and the Technis [id number] lens was placed in the capsular bag without complication. The remaining viscoelastic was removed from the eye with the irrigation-aspiration handpiece. The wounds were hydrated. The anterior chamber was flushed with BSS and the eye was inflated to physiologic pressure. 0.19ml Vigamox was placed in the anterior chamber. The wounds were found to be water tight. The eye was dressed with [place]. The patient was given protective glasses to wear throughout the day and a shield with which to sleep  tonight. The patient was also given drops with which to begin a drop regimen today and will follow-up with me in one day. Implant Name Type Inv. Item Serial No. Manufacturer Lot No. LRB No. Used Action  LENS IOL DIOP 21.0 - [id number] Intraocular Lens LENS IOL DIOP 21.0 [id number] AMO  Left 1 Implanted    Procedure(s): CATARACT EXTRACTION PHACO AND INTRAOCULAR LENS PLACEMENT (IOC)  LEFT (Left)  Electronically signed: Birder Robson 11/10/2018 10:29 AM

## 2018-11-10 NOTE — Anesthesia Postprocedure Evaluation (Signed)
Anesthesia Post Note  [name]  Procedure(s) Performed: CATARACT EXTRACTION PHACO AND INTRAOCULAR LENS PLACEMENT (IOC)  LEFT (Left Eye)  Patient location during evaluation: PACU Anesthesia Type: MAC Level of consciousness: awake and alert, oriented and patient cooperative Pain management: pain level controlled Vital Signs Assessment: post-procedure vital signs reviewed and stable Respiratory status: spontaneous breathing, nonlabored ventilation and respiratory function stable Cardiovascular status: blood pressure returned to baseline and stable Postop Assessment: adequate PO intake Anesthetic complications: no    Darrin Nipper

## 2018-11-10 NOTE — Anesthesia Procedure Notes (Signed)
Procedure Name: MAC Performed by: Fischer Halley M, CRNA Pre-anesthesia Checklist: Patient identified, Emergency Drugs available, Suction available, Patient being monitored and Timeout performed Patient Re-evaluated:Patient Re-evaluated prior to induction Oxygen Delivery Method: Nasal cannula       

## 2018-11-11 ENCOUNTER — Encounter: Payer: Self-pay | Admitting: Ophthalmology

## 2018-11-17 ENCOUNTER — Telehealth: Payer: Self-pay | Admitting: Family Medicine

## 2018-11-17 ENCOUNTER — Encounter: Payer: Self-pay | Admitting: General Surgery

## 2018-11-17 ENCOUNTER — Telehealth: Payer: Self-pay | Admitting: General Practice

## 2018-11-17 DIAGNOSIS — D0511 Intraductal carcinoma in situ of right breast: Secondary | ICD-10-CM

## 2018-11-17 NOTE — Telephone Encounter (Signed)
Patient will go back to her PCP for mammogram .

## 2018-11-17 NOTE — Telephone Encounter (Signed)
Patient is calling she is setup for recalls in September, please call patient and advise. Patient said she may just go back to pcp.

## 2018-11-17 NOTE — Telephone Encounter (Signed)
Needing to get her mammogram set up with [facility] in Sep after Sep 11th.  Thanks, [employer]

## 2018-11-19 NOTE — Telephone Encounter (Signed)
ok 

## 2018-11-25 NOTE — Telephone Encounter (Signed)
Pt checking back on status of setting up the appt to get her mammogram done with [facility]. Pt mentioned she had the next level done since they had found something last time.  Please call pt back.  Thanks, [employer]

## 2018-11-25 NOTE — Anesthesia Preprocedure Evaluation (Addendum)
Anesthesia Evaluation  Patient identified by MRN, date of birth, ID band Patient awake    Reviewed: Allergy & Precautions, NPO status , Patient's Chart, lab work & pertinent test results  History of Anesthesia Complications Negative for: history of anesthetic complications  Airway Mallampati: I   Neck ROM: Full    Dental  (+)  Crowns :   Pulmonary neg pulmonary ROS,    Pulmonary exam normal        Cardiovascular Exercise Tolerance: Good negative cardio ROS Normal cardiovascular exam Rhythm:Regular Rate:Normal     Neuro/Psych  Headaches, PSYCHIATRIC DISORDERS Anxiety Depression    GI/Hepatic GERD  ,  Endo/Other  negative endocrine ROS  Renal/GU negative Renal ROS     Musculoskeletal  (+) Arthritis , Raynaud's   Abdominal   Peds  Hematology Breast CA   Anesthesia Other Findings   Reproductive/Obstetrics                             Anesthesia Physical  Anesthesia Plan  ASA: II  Anesthesia Plan: MAC   Post-op Pain Management:    Induction: Intravenous  PONV Risk Score and Plan: 2 and TIVA and Midazolam  Airway Management Planned: Natural Airway  Additional Equipment:   Intra-op Plan:   Post-operative Plan:   Informed Consent: I have reviewed the patients History and Physical, chart, labs and discussed the procedure including the risks, benefits and alternatives for the proposed anesthesia with the patient or authorized representative who has indicated his/her understanding and acceptance.       Plan Discussed with: CRNA  Anesthesia Plan Comments:        Anesthesia Quick Evaluation

## 2018-11-25 NOTE — Telephone Encounter (Signed)
Please advise 

## 2018-11-26 NOTE — Discharge Instructions (Signed)

## 2018-11-27 ENCOUNTER — Encounter
Admission: RE | Admit: 2018-11-27 | Discharge: 2018-11-27 | Disposition: A | Discharge status: Home / Self Care | Payer: Medicare Other | Source: Ambulatory Visit | Attending: Ophthalmology | Admitting: Ophthalmology

## 2018-11-27 ENCOUNTER — Other Ambulatory Visit: Payer: Self-pay

## 2018-11-27 DIAGNOSIS — Z20828 Contact with and (suspected) exposure to other viral communicable diseases: Secondary | ICD-10-CM | POA: Diagnosis not present

## 2018-11-27 LAB — SARS CORONAVIRUS 2 (TAT 6-24 HRS): SARS Coronavirus 2: NEGATIVE

## 2018-12-01 ENCOUNTER — Encounter
Admission: RE | Disposition: A | Discharge status: Home / Self Care | Payer: Self-pay | Source: Home / Self Care | Attending: Ophthalmology

## 2018-12-01 ENCOUNTER — Other Ambulatory Visit: Payer: Self-pay

## 2018-12-01 ENCOUNTER — Ambulatory Visit: Payer: Medicare Other | Admitting: Anesthesiology

## 2018-12-01 ENCOUNTER — Ambulatory Visit
Admission: RE | Admit: 2018-12-01 | Discharge: 2018-12-01 | Disposition: A | Discharge status: Home / Self Care | Payer: Medicare Other | Attending: Ophthalmology | Admitting: Ophthalmology

## 2018-12-01 DIAGNOSIS — M8580 Other specified disorders of bone density and structure, unspecified site: Secondary | ICD-10-CM | POA: Insufficient documentation

## 2018-12-01 DIAGNOSIS — Z79899 Other long term (current) drug therapy: Secondary | ICD-10-CM | POA: Diagnosis not present

## 2018-12-01 DIAGNOSIS — K219 Gastro-esophageal reflux disease without esophagitis: Secondary | ICD-10-CM | POA: Insufficient documentation

## 2018-12-01 DIAGNOSIS — M1990 Unspecified osteoarthritis, unspecified site: Secondary | ICD-10-CM | POA: Insufficient documentation

## 2018-12-01 DIAGNOSIS — F418 Other specified anxiety disorders: Secondary | ICD-10-CM | POA: Insufficient documentation

## 2018-12-01 DIAGNOSIS — H2511 Age-related nuclear cataract, right eye: Secondary | ICD-10-CM | POA: Diagnosis not present

## 2018-12-01 DIAGNOSIS — Z853 Personal history of malignant neoplasm of breast: Secondary | ICD-10-CM | POA: Insufficient documentation

## 2018-12-01 HISTORY — PX: CATARACT EXTRACTION W/PHACO: SHX586

## 2018-12-01 SURGERY — PHACOEMULSIFICATION, CATARACT, WITH IOL INSERTION
Anesthesia: Monitor Anesthesia Care | Site: Eye | Laterality: Right

## 2018-12-01 MED ORDER — FENTANYL CITRATE (PF) 100 MCG/2ML IJ SOLN
INTRAMUSCULAR | Status: DC | PRN
  Administered 2018-12-01 (×2): 50 ug via INTRAVENOUS

## 2018-12-01 MED ORDER — EPINEPHRINE PF 1 MG/ML IJ SOLN
INTRAOCULAR | Status: DC | PRN
  Administered 2018-12-01: 70 mL via OPHTHALMIC

## 2018-12-01 MED ORDER — LIDOCAINE HCL (PF) 2 % IJ SOLN
INTRAOCULAR | Status: DC | PRN
  Administered 2018-12-01: 1 mL

## 2018-12-01 MED ORDER — ACETAMINOPHEN 160 MG/5ML PO SOLN: 325.0000 mg | ORAL | Status: DC | PRN

## 2018-12-01 MED ORDER — ONDANSETRON HCL 4 MG/2ML IJ SOLN: 4.0000 mg | Freq: Once | INTRAMUSCULAR | Status: DC | PRN

## 2018-12-01 MED ORDER — ARMC OPHTHALMIC DILATING DROPS
1.0000 "application " | OPHTHALMIC | Status: DC | PRN
  Administered 2018-12-01 (×3): 1 via OPHTHALMIC

## 2018-12-01 MED ORDER — ACETAMINOPHEN 325 MG PO TABS: 650.0000 mg | ORAL_TABLET | Freq: Once | ORAL | Status: DC | PRN

## 2018-12-01 MED ORDER — BRIMONIDINE TARTRATE-TIMOLOL 0.2-0.5 % OP SOLN
OPHTHALMIC | Status: DC | PRN
  Administered 2018-12-01: 1 [drp] via OPHTHALMIC

## 2018-12-01 MED ORDER — NA CHONDROIT SULF-NA HYALURON 40-17 MG/ML IO SOLN
INTRAOCULAR | Status: DC | PRN
  Administered 2018-12-01: 1 mL via INTRAOCULAR

## 2018-12-01 MED ORDER — TETRACAINE HCL 0.5 % OP SOLN
1.0000 [drp] | OPHTHALMIC | Status: DC | PRN
  Administered 2018-12-01 (×3): 1 [drp] via OPHTHALMIC

## 2018-12-01 MED ORDER — MOXIFLOXACIN HCL 0.5 % OP SOLN
OPHTHALMIC | Status: DC | PRN
  Administered 2018-12-01: 0.2 mL via OPHTHALMIC

## 2018-12-01 MED ORDER — MIDAZOLAM HCL 2 MG/2ML IJ SOLN
INTRAMUSCULAR | Status: DC | PRN
  Administered 2018-12-01: 2 mg via INTRAVENOUS

## 2018-12-01 SURGICAL SUPPLY — 18 items
CANNULA ANT/CHMB 27GA (MISCELLANEOUS) ×3 IMPLANT
GLOVE SURG LX 8.0 MICRO (GLOVE) ×2
GLOVE SURG LX STRL 8.0 MICRO (GLOVE) ×1 IMPLANT
GLOVE SURG TRIUMPH 8.0 PF LTX (GLOVE) ×3 IMPLANT
GOWN STRL REUS W/ TWL LRG LVL3 (GOWN DISPOSABLE) ×2 IMPLANT
GOWN STRL REUS W/TWL LRG LVL3 (GOWN DISPOSABLE) ×6
LENS IOL TECNIS ITEC 21.5 (Intraocular Lens) ×2 IMPLANT
MARKER SKIN DUAL TIP RULER LAB (MISCELLANEOUS) ×3 IMPLANT
NDL FILTER BLUNT 18X1 1/2 (NEEDLE) ×1 IMPLANT
NDL RETROBULBAR .5 NSTRL (NEEDLE) ×3 IMPLANT
NEEDLE FILTER BLUNT 18X 1/2SAF (NEEDLE) ×2
NEEDLE FILTER BLUNT 18X1 1/2 (NEEDLE) ×1 IMPLANT
PACK EYE AFTER SURG (MISCELLANEOUS) ×3 IMPLANT
PACK OPTHALMIC (MISCELLANEOUS) ×3 IMPLANT
PACK PORFILIO (MISCELLANEOUS) ×3 IMPLANT
SYR 3ML LL SCALE MARK (SYRINGE) ×3 IMPLANT
SYR TB 1ML LUER SLIP (SYRINGE) ×3 IMPLANT
WIPE NON LINTING 3.25X3.25 (MISCELLANEOUS) ×3 IMPLANT

## 2018-12-01 NOTE — H&P (Signed)
All labs reviewed. Abnormal studies sent to patients PCP when indicated.  Previous H&P reviewed, patient examined, there are NO CHANGES.  Sherri Dufrane Porfilio8/4/20207:57 AM

## 2018-12-01 NOTE — Op Note (Signed)
PREOPERATIVE DIAGNOSIS:  Nuclear sclerotic cataract of the right eye.   POSTOPERATIVE DIAGNOSIS:  H25.011 CATARACT   OPERATIVE PROCEDURE: Procedure(s): CATARACT EXTRACTION PHACO AND INTRAOCULAR LENS PLACEMENT (IOC)  RIGHT   SURGEON:  Birder Robson, MD.   ANESTHESIA:  Anesthesiologist: Darrin Nipper, MD CRNA: Jeannene Patella, CRNA  1.      Managed anesthesia care. 2.      0.39ml of Shugarcaine was instilled in the eye following the paracentesis.   COMPLICATIONS:  None.   TECHNIQUE:   Stop and chop   DESCRIPTION OF PROCEDURE:  The patient was examined and consented in the preoperative holding area where the aforementioned topical anesthesia was applied to the right eye and then brought back to the Operating Room where the right eye was prepped and draped in the usual sterile ophthalmic fashion and a lid speculum was placed. A paracentesis was created with the side port blade and the anterior chamber was filled with viscoelastic. A near clear corneal incision was performed with the steel keratome. A continuous curvilinear capsulorrhexis was performed with a cystotome followed by the capsulorrhexis forceps. Hydrodissection and hydrodelineation were carried out with BSS on a blunt cannula. The lens was removed in a stop and chop  technique and the remaining cortical material was removed with the irrigation-aspiration handpiece. The capsular bag was inflated with viscoelastic and the Technis [id number]  lens was placed in the capsular bag without complication. The remaining viscoelastic was removed from the eye with the irrigation-aspiration handpiece. The wounds were hydrated. The anterior chamber was flushed with BSS and the eye was inflated to physiologic pressure. 0.16ml of Vigamox was placed in the anterior chamber. The wounds were found to be water tight. The eye was dressed with Combigan. The patient was given protective glasses to wear throughout the day and a shield with which to sleep  tonight. The patient was also given drops with which to begin a drop regimen today and will follow-up with me in one day. Implant Name Type Inv. Item Serial No. Manufacturer Lot No. LRB No. Used Action  LENS IOL DIOP 21.5 - [id number] Intraocular Lens LENS IOL DIOP 21.5 [id number] AMO  Right 1 Implanted   Procedure(s): CATARACT EXTRACTION PHACO AND INTRAOCULAR LENS PLACEMENT (IOC)  RIGHT (Right)  Electronically signed: Birder Robson 12/01/2018 8:22 AM

## 2018-12-01 NOTE — Transfer of Care (Signed)
Immediate Anesthesia Transfer of Care Note  [name]  Procedure(s) Performed: CATARACT EXTRACTION PHACO AND INTRAOCULAR LENS PLACEMENT (IOC)  RIGHT (Right Eye)  Patient Location: PACU  Anesthesia Type: MAC  Level of Consciousness: awake, alert  and patient cooperative  Airway and Oxygen Therapy: Patient Spontanous Breathing and Patient connected to supplemental oxygen  Post-op Assessment: Post-op Vital signs reviewed, Patient's Cardiovascular Status Stable, Respiratory Function Stable, Patent Airway and No signs of Nausea or vomiting  Post-op Vital Signs: Reviewed and stable  Complications: No apparent anesthesia complications

## 2018-12-01 NOTE — Anesthesia Postprocedure Evaluation (Signed)
Anesthesia Post Note  [name]  Procedure(s) Performed: CATARACT EXTRACTION PHACO AND INTRAOCULAR LENS PLACEMENT (IOC)  RIGHT (Right Eye)  Patient location during evaluation: PACU Anesthesia Type: MAC Level of consciousness: awake and alert, oriented and patient cooperative Pain management: pain level controlled Vital Signs Assessment: post-procedure vital signs reviewed and stable Respiratory status: spontaneous breathing, nonlabored ventilation and respiratory function stable Cardiovascular status: blood pressure returned to baseline and stable Postop Assessment: adequate PO intake Anesthetic complications: no    Darrin Nipper

## 2018-12-02 ENCOUNTER — Encounter: Payer: Self-pay | Admitting: Ophthalmology

## 2018-12-08 ENCOUNTER — Telehealth: Payer: Self-pay | Admitting: Family Medicine

## 2018-12-08 NOTE — Telephone Encounter (Signed)
OK to schedule bilateral diagnostic mammogram next month.

## 2018-12-08 NOTE — Telephone Encounter (Signed)
Order was placed.

## 2018-12-08 NOTE — Telephone Encounter (Signed)
Patient has called 4 times to get this done correctly.  Patient needs an order for a Bilateral Diagnostic Mammogram for [facility].  She had a lumpectomy last year so her mammogram has be order for diagnostic.

## 2019-01-04 ENCOUNTER — Other Ambulatory Visit: Payer: Self-pay | Admitting: Family Medicine

## 2019-01-11 ENCOUNTER — Ambulatory Visit
Admission: RE | Admit: 2019-01-11 | Discharge: 2019-01-11 | Disposition: A | Discharge status: Home / Self Care | Payer: Medicare Other | Source: Ambulatory Visit | Attending: Family Medicine | Admitting: Family Medicine

## 2019-01-11 DIAGNOSIS — D0511 Intraductal carcinoma in situ of right breast: Secondary | ICD-10-CM | POA: Insufficient documentation

## 2019-01-11 HISTORY — DX: Personal history of irradiation: Z92.3

## 2019-01-11 HISTORY — DX: Malignant neoplasm of unspecified site of unspecified female breast: C50.919

## 2019-01-12 ENCOUNTER — Ambulatory Visit (INDEPENDENT_AMBULATORY_CARE_PROVIDER_SITE_OTHER): Payer: Medicare Other

## 2019-01-12 DIAGNOSIS — Z23 Encounter for immunization: Secondary | ICD-10-CM

## 2019-02-01 ENCOUNTER — Other Ambulatory Visit: Payer: Self-pay | Admitting: Family Medicine

## 2019-02-18 ENCOUNTER — Ambulatory Visit: Payer: Self-pay | Admitting: Family Medicine

## 2019-05-05 ENCOUNTER — Other Ambulatory Visit: Payer: Self-pay

## 2019-05-05 ENCOUNTER — Ambulatory Visit
Admission: RE | Admit: 2019-05-05 | Discharge: 2019-05-05 | Disposition: A | Discharge status: Home / Self Care | Payer: Medicare PPO | Source: Ambulatory Visit | Attending: Radiation Oncology | Admitting: Radiation Oncology

## 2019-05-05 ENCOUNTER — Encounter: Payer: Self-pay | Admitting: Radiation Oncology

## 2019-05-05 DIAGNOSIS — Z923 Personal history of irradiation: Secondary | ICD-10-CM | POA: Insufficient documentation

## 2019-05-05 DIAGNOSIS — D0511 Intraductal carcinoma in situ of right breast: Secondary | ICD-10-CM

## 2019-05-05 DIAGNOSIS — Z86000 Personal history of in-situ neoplasm of breast: Secondary | ICD-10-CM | POA: Insufficient documentation

## 2019-05-05 NOTE — Progress Notes (Signed)
Radiation Oncology Follow up Note  Name: Sherri Cisneros   Date:   05/05/2019 MRN:  [id number] DOB: March 13, 1946    This 74 y.o. 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  Seen today in routine follow-up she is doing well.  She specifically denies breast tenderness cough or bone pain.  She is not on antiestrogen therapy based on negative ER/PR status..  She had mammograms back in September which I have reviewed were BI-RADS 2 benign.  COMPLICATIONS OF TREATMENT: none  FOLLOW UP COMPLIANCE: keeps appointments   PHYSICAL EXAM:  BP (!) (P) 163/84 (BP Location: Left Arm, Patient Position: Sitting)   Pulse (P) 82   Temp (P) 97.6 F (36.4 C) (Tympanic)   Resp (P) 16   Wt (P) 133 lb 6.4 oz (60.5 kg)   BMI (P) 24.40 kg/m  Lungs are clear to A&P cardiac examination essentially unremarkable with regular rate and rhythm. No dominant mass or nodularity is noted in either breast in 2 positions examined. Incision is well-healed. No axillary or supraclavicular adenopathy is appreciated. Cosmetic result is excellent.  Well-developed well-nourished patient in NAD. HEENT reveals PERLA, EOMI, discs not visualized.  Oral cavity is clear. No oral mucosal lesions are identified. Neck is clear without evidence of cervical or supraclavicular adenopathy. Lungs are clear to A&P. Cardiac examination is essentially unremarkable with regular rate and rhythm without murmur rub or thrill. Abdomen is benign with no organomegaly or masses noted. Motor sensory and DTR levels are equal and symmetric in the upper and lower extremities. Cranial nerves II through XII are grossly intact.  Proprioception is intact. No peripheral adenopathy or edema is identified. No motor or sensory levels are noted. Crude visual fields are within normal range.  RADIOLOGY RESULTS: Mammograms reviewed compatible with above-stated findings  PLAN: Present time patient is doing well with no evidence of disease 1 year out.  I am pleased with her overall progress.  I have asked to see her back in 1 year for follow-up.  Patient knows to call with any concerns at any time.  I would like to take this opportunity to thank you for allowing me to participate in the care of your patient.Noreene Filbert, MD

## 2019-05-21 ENCOUNTER — Other Ambulatory Visit: Payer: Self-pay | Admitting: Family Medicine

## 2019-05-21 NOTE — Telephone Encounter (Signed)
[facility] faxed refill request for the following medications:   ALPRAZolam (XANAX) 0.5 MG tablet    Please advise.  

## 2019-05-24 NOTE — Telephone Encounter (Signed)
Patient requesting refill. Last office visit was 10/19/18. Please advise.

## 2019-05-26 MED ORDER — ALPRAZOLAM 0.5 MG PO TABS: ORAL_TABLET | ORAL | 1 refills | Status: DC

## 2019-07-28 ENCOUNTER — Other Ambulatory Visit: Payer: Self-pay | Admitting: Family Medicine

## 2019-10-07 ENCOUNTER — Other Ambulatory Visit: Payer: Self-pay | Admitting: Physician Assistant

## 2019-10-07 ENCOUNTER — Other Ambulatory Visit: Payer: Self-pay | Admitting: Family Medicine

## 2019-10-07 NOTE — Telephone Encounter (Signed)
Requested medication (s) are due for refill today: Alrpazolam & Zolpidem, yes  Requested medication (s) are on the active medication list: yes  Last refill:  07/28/19  Future visit scheduled:no  Notes to clinic: not delegated; no valid encounter within last 6 months   Requested Prescriptions  Pending Prescriptions Disp Refills   ALPRAZolam (XANAX) 0.5 MG tablet [Pharmacy Med Name: ALPRAZOLAM 0.5 MG TAB] 60 tablet     Sig: TAKE 1 TABLET BY MOUTH TWICE DAILY AS NEEDED FOR ANXIETY      Not Delegated - Psychiatry:  Anxiolytics/Hypnotics Failed - 10/07/2019 10:24 AM      Failed - This refill cannot be delegated      Failed - Urine Drug Screen completed in last 360 days.      Failed - Valid encounter within last 6 months    Recent Outpatient Visits           11 months ago Annual physical exam   [facility] Jerrol Banana., MD   2 years ago [facility] Jerrol Banana., MD   2 years ago Right ear pain   [facility] [place] Carles Collet M, [place]   2 years ago Annual physical exam   [facility] Jerrol Banana., MD   3 years ago Bladder infection, chronic   [facility], Fabio Bering [place], [place]

## 2019-10-07 NOTE — Telephone Encounter (Signed)
Refill request for Alprazolam last refilled 07/28/19; no valid encounter within last 6 months; no upcoming visits noted; attempted to contact pt; left message on voicemail; will route to office for final disposition  Requested medication (s) are due for refill today: Alprazolam, yes  Requested medication (s) are on the active medication list:yes  Last refill: 07/28/19  Future visit scheduled: no  Notes to clinic: not delegated

## 2019-10-07 NOTE — Progress Notes (Signed)
Established patient visit  I,April Miller,acting as a scribe for Wilhemena Durie, MD.,have documented all relevant documentation on the behalf of Wilhemena Durie, MD,as directed by  Wilhemena Durie, MD while in the presence of Wilhemena Durie, MD.  [name]   DOB: [birth year]   74 y.o. Female  MRN: [id number] Visit Date: 10/11/2019  Today's healthcare provider: Wilhemena Durie, MD   Chief Complaint  Patient presents with  . Follow-up  . Hyperlipidemia  . Gastroesophageal Reflux  . Insomnia  . Anxiety   Subjective    HPI  Overall patient is feeling well.  She does complain of chronic left knee pain and has been told that she needs a total knee replacement at some point time.  Orthopedics now has her own Osteo Bi-Flex and turmeric which seems to have helped a little bit.  She has a skin lesion on her right breast and some occasional tenderness in the right lower quadrant of the breast.  No mass-effect. She knows she needs to cut down on zolpidem and Xanax and has done that.  She is taking zolpidem half a tablet about once a week to twice a week. Lipid/Cholesterol, follow-up  Last Lipid Panel: Lab Results  Component Value Date   CHOL 245 (H) 10/20/2018   LDLCALC 150 (H) 10/20/2018   HDL 64 10/20/2018   TRIG 154 (H) 10/20/2018    She was last seen for this 1 years ago.  Management since that visit includes; labs checked showing-stable. She reports good compliance with treatment. She is not having side effects. none She is following a Regular diet. Current exercise: walking  Last metabolic panel Lab Results  Component Value Date   GLUCOSE 89 10/20/2018   NA 140 10/20/2018   K 4.3 10/20/2018   BUN 18 10/20/2018   CREATININE 0.79 10/20/2018   GFRNONAA 76 10/20/2018   GFRAA 87 10/20/2018   CALCIUM 9.4 10/20/2018   AST 15 10/20/2018   ALT 10 10/20/2018   The 10-year ASCVD risk score Mikey Bussing DC Jr., et al., 2013) is:  17.2%  --------------------------------------------------------------------  Gastroesophageal reflux disease, esophagitis presence not specified  From 10/20/2018-Omeprazole.  Primary insomnia From 10/20/2018-Advised to cut back on Ambien.  Anxiety From 10/20/2018-Improved.     Medications: Outpatient Medications Prior to Visit  Medication Sig  . acetaminophen (TYLENOL) 500 MG tablet Take 500 mg by mouth every 6 (six) hours as needed for moderate pain or headache.   . ALPRAZolam (XANAX) 0.5 MG tablet TAKE 1 TABLET(0.5 MG) BY MOUTH TWICE DAILY AS NEEDED FOR ANXIETY  . CALCIUM-VITAMIN D PO Take 1 tablet by mouth daily.   . cetirizine (ZYRTEC ALLERGY) 10 MG tablet Take 10 mg by mouth daily as needed for allergies.   . meloxicam (MOBIC) 15 MG tablet Take 15 mg by mouth daily.  . Nutritional Supplements (OSTEO ADVANCE PO) Take by mouth.  Marland Kitchen omeprazole (PRILOSEC) 20 MG capsule TAKE 1 CAPSULE TWICE DAILY AS NEEDED  . Polyethyl Glycol-Propyl Glycol (SYSTANE) 0.4-0.3 % SOLN Place 1-2 drops into both eyes 3 (three) times daily as needed (for dry eyes).  Marland Kitchen zolpidem (AMBIEN) 10 MG tablet TAKE ONE-HALF TABLET AT BEDTIME   No facility-administered medications prior to visit.    Review of Systems  Constitutional: Negative for appetite change, chills, fatigue and fever.  Eyes: Negative.   Respiratory: Negative for chest tightness and shortness of breath.   Cardiovascular: Negative for chest pain and palpitations.  Gastrointestinal: Negative for  abdominal pain, nausea and vomiting.  Endocrine: Negative.   Musculoskeletal: Positive for arthralgias and joint swelling.  Allergic/Immunologic: Negative.   Neurological: Negative for dizziness and weakness.  Hematological: Negative.   Psychiatric/Behavioral: Negative.       Objective    BP (!) 156/76 (BP Location: Right Arm, Patient Position: Sitting, Cuff Size: Large)   Pulse 90   Temp (!) 97.5 F (36.4 C) (Other (Comment))   Resp 18   Ht 5'  2" (1.575 m)   Wt 131 lb (59.4 kg)   SpO2 97%   BMI 23.96 kg/m    Physical Exam Vitals and nursing note reviewed.  Constitutional:      Appearance: Normal appearance. She is normal weight.  Cardiovascular:     Rate and Rhythm: Normal rate and regular rhythm.     Pulses: Normal pulses.     Heart sounds: Normal heart sounds.  Pulmonary:     Effort: Pulmonary effort is normal.     Breath sounds: Normal breath sounds.  Chest:     Breasts:        Right: Inverted nipple present.        Left: Normal.  Abdominal:     General: Bowel sounds are normal.     Palpations: Abdomen is soft.  Musculoskeletal:        General: Swelling present.     Cervical back: Normal range of motion and neck supple.     Comments: Mild effusion of left knee  Skin:    General: Skin is warm and dry.  Neurological:     Mental Status: She is alert.  Psychiatric:        Mood and Affect: Mood normal.        Behavior: Behavior normal.       No results found for any visits on 10/11/19.  Assessment & Plan     1. Hypercholesteremia Check labs  2. Anxiety Improving.  Continue to cut back on Xanax - ALPRAZolam (XANAX) 0.5 MG tablet; TAKE 1 TABLET(0.5 MG) BY MOUTH TWICE DAILY AS NEEDED FOR ANXIETY  Dispense: 60 tablet; Refill: 2  3. Primary insomnia Continuing to cut back on zolpidem. - zolpidem (AMBIEN) 10 MG tablet; TAKE ONE-HALF TABLET AT BEDTIME  Dispense: 30 tablet; Refill: 2  4. Chronic pain of left knee Needs TKR per orthopedics.  Patient will know when it is time.  5. Ductal carcinoma in situ (DCIS) of right breast Needs yearly mammogram and breast exam 6.  GERD On going omeprazole needed. No follow-ups on file.      I, Wilhemena Durie, MD, have reviewed all documentation for this visit. The documentation on 10/13/19 for the exam, diagnosis, procedures, and orders are all accurate and complete.    Jaelyn Cloninger Cranford Mon, MD  [facility] [phone] (phone) [phone]  (fax)  [place]

## 2019-10-07 NOTE — Telephone Encounter (Signed)
Please advise? Patient has an upcoming appointment on 6/14?

## 2019-10-07 NOTE — Telephone Encounter (Addendum)
Pt has schedule for physical with dr Rosanna Randy on 9/2/2021and medication follow up on 10-11-2019

## 2019-10-11 ENCOUNTER — Ambulatory Visit: Payer: Medicare PPO | Admitting: Family Medicine

## 2019-10-11 ENCOUNTER — Encounter: Payer: Self-pay | Admitting: Family Medicine

## 2019-10-11 ENCOUNTER — Other Ambulatory Visit: Payer: Self-pay

## 2019-10-11 VITALS — BP 156/76 | HR 90 | Temp 97.5°F | Resp 18 | Ht 62.0 in | Wt 131.0 lb

## 2019-10-11 DIAGNOSIS — D0511 Intraductal carcinoma in situ of right breast: Secondary | ICD-10-CM

## 2019-10-11 DIAGNOSIS — F419 Anxiety disorder, unspecified: Secondary | ICD-10-CM

## 2019-10-11 DIAGNOSIS — M25562 Pain in left knee: Secondary | ICD-10-CM | POA: Diagnosis not present

## 2019-10-11 DIAGNOSIS — E7800 Pure hypercholesterolemia, unspecified: Secondary | ICD-10-CM | POA: Diagnosis not present

## 2019-10-11 DIAGNOSIS — F5101 Primary insomnia: Secondary | ICD-10-CM | POA: Diagnosis not present

## 2019-10-11 DIAGNOSIS — G8929 Other chronic pain: Secondary | ICD-10-CM

## 2019-10-11 MED ORDER — ZOLPIDEM TARTRATE 10 MG PO TABS: ORAL_TABLET | ORAL | 2 refills | Status: DC

## 2019-10-11 MED ORDER — ALPRAZOLAM 0.5 MG PO TABS: ORAL_TABLET | ORAL | 2 refills | Status: DC

## 2019-11-10 ENCOUNTER — Telehealth: Payer: Self-pay

## 2019-11-10 NOTE — Telephone Encounter (Signed)
Copied from [place] [id number]. Topic: General - Other >> Nov 10, 2019  8:56 AM Lennox Solders wrote: Reason for CRM: pt is calling and would like a recommendation for dermatologist from dr Rosanna Randy or carol

## 2019-11-12 NOTE — Telephone Encounter (Signed)
Morning Sun

## 2019-11-12 NOTE — Telephone Encounter (Signed)
Patient advised.

## 2019-12-13 ENCOUNTER — Telehealth: Payer: Self-pay | Admitting: Family Medicine

## 2019-12-13 DIAGNOSIS — Z1231 Encounter for screening mammogram for malignant neoplasm of breast: Secondary | ICD-10-CM

## 2019-12-13 NOTE — Telephone Encounter (Signed)
Patient requesting order for mammogram sent to [facility]. Patient states she is unable to schedule until order is sent.

## 2019-12-13 NOTE — Telephone Encounter (Signed)
I advised patient that I have placed order for diagnostic mammogram and she can contact [facility] to scheduled. KW

## 2019-12-20 ENCOUNTER — Encounter: Payer: Medicare PPO | Admitting: Family Medicine

## 2019-12-22 ENCOUNTER — Encounter: Payer: Medicare PPO | Admitting: Family Medicine

## 2019-12-27 ENCOUNTER — Encounter: Payer: Medicare PPO | Admitting: Family Medicine

## 2019-12-28 NOTE — Progress Notes (Signed)
I,Roshena L Chambers,acting as a scribe for Wilhemena Durie, MD.,have documented all relevant documentation on the behalf of Wilhemena Durie, MD,as directed by  Wilhemena Durie, MD while in the presence of Wilhemena Durie, MD.  Annual Wellness Visit     [name], Female    DOB: [birth year], 74 y.o.   MRN: [id number] Visit Date: 12/30/2019  Today's Provider: Wilhemena Durie, MD   Chief Complaint  Patient presents with  . Medicare Wellness  . Urinary Frequency   Subjective    Sherri Cisneros is a 74 y.o. female who presents today for her Annual Wellness Visit. She reports consuming a general diet. Home exercise routine includes walking daily. She generally feels fairly well. She reports sleeping fairly well. She does have additional problems to discuss today.   Urinary Frequency  This is a new problem. Episode onset: 2-3 weeks. The problem has been unchanged. The patient is experiencing no pain. There has been no fever. Associated symptoms include frequency and urgency. Pertinent negatives include no chills, discharge, flank pain, hematuria, hesitancy, nausea, sweats or vomiting. She has tried nothing for the symptoms.   She had normal colonoscopy in 2018 but complains of chronic constipation.      Medications: Outpatient Medications Prior to Visit  Medication Sig  . acetaminophen (TYLENOL) 500 MG tablet Take 500 mg by mouth every 6 (six) hours as needed for moderate pain or headache.   . ALPRAZolam (XANAX) 0.5 MG tablet TAKE 1 TABLET(0.5 MG) BY MOUTH TWICE DAILY AS NEEDED FOR ANXIETY  . CALCIUM-VITAMIN D PO Take 1 tablet by mouth daily.   . cetirizine (ZYRTEC ALLERGY) 10 MG tablet Take 10 mg by mouth daily as needed for allergies.   . meloxicam (MOBIC) 15 MG tablet Take 15 mg by mouth daily.  . Nutritional Supplements (OSTEO ADVANCE PO) Take by mouth.  Marland Kitchen omeprazole (PRILOSEC) 20 MG capsule TAKE 1 CAPSULE TWICE DAILY AS NEEDED  . Polyethyl  Glycol-Propyl Glycol (SYSTANE) 0.4-0.3 % SOLN Place 1-2 drops into both eyes 3 (three) times daily as needed (for dry eyes).  Marland Kitchen zolpidem (AMBIEN) 10 MG tablet TAKE ONE-HALF TABLET AT BEDTIME   No facility-administered medications prior to visit.    Allergies  Allergen Reactions  . Actonel [Risedronate Sodium] Other (See Comments)    GI side effects  . Alendronate Other (See Comments)    GI side effects  . Clarithromycin Other (See Comments)    Unknown  . Erythromycin Other (See Comments)    Jaundice;  CAN TAKE MACROBID PER PATIENT  . Sulfa Antibiotics Other (See Comments)    Unknown  . Bactrim [Sulfamethoxazole-Trimethoprim] Rash  . Grapeseed Extract [Nutritional Supplements] Rash    Some raw fruits cause mouth to "break out"    Patient Care Team: Jerrol Banana., MD as PCP - General (Family Medicine) Arelia Sneddon, OD as Consulting Physician (Optometry) [place], Romilda Garret, DPM as Consulting Physician (Podiatry) Jannet Mantis, MD as Consulting Physician (Dermatology)  Review of Systems  Constitutional: Negative for chills, fatigue and fever.  HENT: Negative for congestion, ear pain, rhinorrhea, sneezing and sore throat.   Eyes: Negative.  Negative for pain and redness.  Respiratory: Negative for cough, shortness of breath and wheezing.   Cardiovascular: Negative for chest pain and leg swelling.  Gastrointestinal: Positive for abdominal distention. Negative for abdominal pain, blood in stool, constipation, diarrhea, nausea and vomiting.  Endocrine: Negative for polydipsia and polyphagia.  Genitourinary: Positive for  frequency and urgency. Negative for dysuria, flank pain, hematuria, hesitancy, pelvic pain, vaginal bleeding and vaginal discharge.  Musculoskeletal: Positive for arthralgias. Negative for back pain, gait problem and joint swelling.  Skin: Negative for rash.  Allergic/Immunologic: Positive for environmental allergies.  Neurological: Negative.  Negative  for dizziness, tremors, seizures, weakness, light-headedness, numbness and headaches.  Hematological: Negative for adenopathy.  Psychiatric/Behavioral: Negative.  Negative for behavioral problems, confusion and dysphoric mood. The patient is not nervous/anxious and is not hyperactive.        Objective    Vitals: BP 136/76 (BP Location: Right Arm, Patient Position: Sitting, Cuff Size: Normal)   Pulse 76   Temp 97.8 F (36.6 C) (Oral)   Resp 16   Ht 5\' 2"  (1.575 m)   Wt 130 lb (59 kg)   BMI 23.78 kg/m  Wt Readings from Last 3 Encounters:  12/30/19 130 lb (59 kg)  10/11/19 131 lb (59.4 kg)  05/05/19 (P) 133 lb 6.4 oz (60.5 kg)      Physical Exam Vitals reviewed.  Constitutional:      Appearance: She is well-developed.  HENT:     Head: Normocephalic and atraumatic.     Right Ear: External ear normal.     Left Ear: External ear normal.     Nose: Nose normal.  Eyes:     Conjunctiva/sclera: Conjunctivae normal.     Pupils: Pupils are equal, round, and reactive to light.  Cardiovascular:     Rate and Rhythm: Normal rate and regular rhythm.     Heart sounds: Normal heart sounds.  Pulmonary:     Effort: Pulmonary effort is normal.     Breath sounds: Normal breath sounds.  Abdominal:     General: Bowel sounds are normal.     Palpations: Abdomen is soft.  Genitourinary:    General: Normal vulva.     Vagina: No vaginal discharge.     Rectum: Normal. Guaiac result negative.     Comments: Mild rectocele and hemorrhoidal tag noted. Musculoskeletal:        General: Normal range of motion.     Cervical back: Normal range of motion and neck supple.  Skin:    General: Skin is warm and dry.  Neurological:     Mental Status: She is alert and oriented to person, place, and time.  Psychiatric:        Behavior: Behavior normal.        Thought Content: Thought content normal.        Judgment: Judgment normal.    Breast exam was normal in June.  Most recent functional status  assessment: In your present state of health, do you have any difficulty performing the following activities: 12/30/2019  Hearing? N  Vision? N  Difficulty concentrating or making decisions? N  Walking or climbing stairs? N  Dressing or bathing? N  Doing errands, shopping? N  Some recent data might be hidden   Most recent fall risk assessment: Fall Risk  12/30/2019  Falls in the past year? 0  Number falls in past yr: 0  Injury with Fall? 0  Follow up Falls evaluation completed    Most recent depression screenings: PHQ 2/9 Scores 12/30/2019 10/19/2018  PHQ - 2 Score 0 1  PHQ- 9 Score - 3   Most recent cognitive screening: 6CIT Screen 02/29/2016  What Year? 0 points  What month? 0 points  What time? 0 points  Count back from 20 0 points  Months in reverse 0 points  Repeat phrase 0 points  Total Score 0   Most recent Audit-C alcohol use screening Alcohol Use Disorder Test (AUDIT) 12/30/2019  1. How often do you have a drink containing alcohol? 1  2. How many drinks containing alcohol do you have on a typical day when you are drinking? 0  3. How often do you have six or more drinks on one occasion? 0  AUDIT-C Score 1  Alcohol Brief Interventions/Follow-up AUDIT Score <7 follow-up not indicated   A score of 3 or more in women, and 4 or more in men indicates increased risk for alcohol abuse, EXCEPT if all of the points are from question 1   Results for orders placed or performed in visit on 12/30/19  POCT Urinalysis Dipstick  Result Value Ref Range   Color, UA yellow    Clarity, UA cloudy    Glucose, UA Negative Negative   Bilirubin, UA negative    Ketones, UA negative    Spec Grav, UA 1.020 1.010 - 1.025   Blood, UA large (hemolyzed)    pH, UA 6.0 5.0 - 8.0   Protein, UA Negative Negative   Urobilinogen, UA 0.2 0.2 or 1.0 E.U./dL   Nitrite, UA negative    Leukocytes, UA Moderate (2+) (A) Negative   Appearance     Odor      Assessment & Plan     Annual wellness visit  done today including the all of the following: Reviewed patient's Family Medical History Reviewed and updated list of patient's medical providers Assessment of cognitive impairment was done Assessed patient's functional ability Established a written schedule for health screening [place] Completed and Reviewed  Exercise Activities and Dietary recommendations Goals    . Increase water intake     Starting 02/29/16, I will increase my water intake to 6 glasses a day.       Immunization History  Administered Date(s) Administered  . Fluad Quad(high Dose 65+) 01/12/2019  . Influenza, High Dose Seasonal PF 01/04/2015, 02/29/2016, 12/31/2016, 01/24/2018  . PFIZER SARS-COV-2 Vaccination 06/09/2019, 06/30/2019  . Pneumococcal Conjugate-13 01/04/2015  . Pneumococcal Polysaccharide-23 01/21/2012  . Td 08/17/2003, 12/21/2010  . Tdap 07/02/2011, 03/07/2014  . Zoster 08/12/2011    Health Maintenance  Topic Date Due  . INFLUENZA VACCINE  07/27/2020 (Originally 11/28/2019)  . MAMMOGRAM  01/10/2021  . COLONOSCOPY  07/11/2021  . TETANUS/TDAP  03/07/2024  . DEXA SCAN  Completed  . COVID-19 Vaccine  Completed  . Hepatitis C Screening  Completed  . PNA vac Low Risk Adult  Completed     Discussed health benefits of physical activity, and encouraged her to engage in regular exercise appropriate for her age and condition.  1. Encounter for Medicare annual wellness exam   2. Annual physical exam   3. Hypercholesteremia  - CBC with Differential/Platelet - Comprehensive Metabolic Panel (CMET) - TSH - Lipid panel  4. Elevated TSH  - TSH  5. Urinary frequency  - POCT Urinalysis Dipstick  6. Urinary tract infection with hematuria, site unspecified  - Urine Culture  7. Gastroesophageal reflux disease, unspecified whether esophagitis present  - CBC with Differential/Platelet  8. Screening for colon cancer Try daily Colace for mild constipation. - IFOBT POC  (occult bld, rslt in office); Future - IFOBT POC (occult bld, rslt in office)  Return in about 6 months (around 06/28/2020).        Francis Yardley Cranford Mon, MD  [facility] [phone] (phone) [phone] (fax)  [facility]

## 2019-12-30 ENCOUNTER — Ambulatory Visit (INDEPENDENT_AMBULATORY_CARE_PROVIDER_SITE_OTHER): Payer: Medicare PPO | Admitting: Family Medicine

## 2019-12-30 ENCOUNTER — Encounter: Payer: Self-pay | Admitting: Family Medicine

## 2019-12-30 ENCOUNTER — Other Ambulatory Visit: Payer: Self-pay

## 2019-12-30 VITALS — BP 136/76 | HR 76 | Temp 97.8°F | Resp 16 | Ht 62.0 in | Wt 130.0 lb

## 2019-12-30 DIAGNOSIS — R350 Frequency of micturition: Secondary | ICD-10-CM

## 2019-12-30 DIAGNOSIS — R319 Hematuria, unspecified: Secondary | ICD-10-CM

## 2019-12-30 DIAGNOSIS — Z0000 Encounter for general adult medical examination without abnormal findings: Secondary | ICD-10-CM | POA: Diagnosis not present

## 2019-12-30 DIAGNOSIS — N390 Urinary tract infection, site not specified: Secondary | ICD-10-CM

## 2019-12-30 DIAGNOSIS — Z1211 Encounter for screening for malignant neoplasm of colon: Secondary | ICD-10-CM

## 2019-12-30 DIAGNOSIS — E7800 Pure hypercholesterolemia, unspecified: Secondary | ICD-10-CM

## 2019-12-30 DIAGNOSIS — R7989 Other specified abnormal findings of blood chemistry: Secondary | ICD-10-CM | POA: Diagnosis not present

## 2019-12-30 DIAGNOSIS — K219 Gastro-esophageal reflux disease without esophagitis: Secondary | ICD-10-CM

## 2019-12-30 LAB — POCT URINALYSIS DIPSTICK
Bilirubin, UA: NEGATIVE
Glucose, UA: NEGATIVE
Ketones, UA: NEGATIVE
Nitrite, UA: NEGATIVE
Protein, UA: NEGATIVE
Spec Grav, UA: 1.02 (ref 1.010–1.025)
Urobilinogen, UA: 0.2 E.U./dL
pH, UA: 6 (ref 5.0–8.0)

## 2019-12-30 LAB — IFOBT (OCCULT BLOOD): IFOBT: NEGATIVE

## 2019-12-30 NOTE — Patient Instructions (Signed)
Recommend OTC Colace daily

## 2019-12-31 LAB — COMPREHENSIVE METABOLIC PANEL
ALT: 22 IU/L (ref 0–32)
AST: 16 IU/L (ref 0–40)
Albumin/Globulin Ratio: 1.9 (ref 1.2–2.2)
Albumin: 4.1 g/dL (ref 3.7–4.7)
Alkaline Phosphatase: 51 IU/L (ref 48–121)
BUN/Creatinine Ratio: 25 (ref 12–28)
BUN: 22 mg/dL (ref 8–27)
Bilirubin Total: 0.3 mg/dL (ref 0.0–1.2)
CO2: 23 mmol/L (ref 20–29)
Calcium: 9.4 mg/dL (ref 8.7–10.3)
Chloride: 105 mmol/L (ref 96–106)
Creatinine, Ser: 0.88 mg/dL (ref 0.57–1.00)
GFR calc Af Amer: 75 mL/min/{1.73_m2} (ref >59)
GFR calc non Af Amer: 65 mL/min/{1.73_m2} (ref >59)
Globulin, Total: 2.2 g/dL (ref 1.5–4.5)
Glucose: 91 mg/dL (ref 65–99)
Potassium: 4.5 mmol/L (ref 3.5–5.2)
Sodium: 140 mmol/L (ref 134–144)
Total Protein: 6.3 g/dL (ref 6.0–8.5)

## 2019-12-31 LAB — CBC WITH DIFFERENTIAL/PLATELET
Basophils Absolute: 0 10*3/uL (ref 0.0–0.2)
Basos: 1 %
EOS (ABSOLUTE): 0.1 10*3/uL (ref 0.0–0.4)
Eos: 2 %
Hematocrit: 34.7 % (ref 34.0–46.6)
Hemoglobin: 11.4 g/dL (ref 11.1–15.9)
Immature Grans (Abs): 0 10*3/uL (ref 0.0–0.1)
Immature Granulocytes: 0 %
Lymphocytes Absolute: 1.7 10*3/uL (ref 0.7–3.1)
Lymphs: 52 %
MCH: 29.9 pg (ref 26.6–33.0)
MCHC: 32.9 g/dL (ref 31.5–35.7)
MCV: 91 fL (ref 79–97)
Monocytes Absolute: 0.3 10*3/uL (ref 0.1–0.9)
Monocytes: 8 %
Neutrophils Absolute: 1.3 10*3/uL — ABNORMAL LOW (ref 1.4–7.0)
Neutrophils: 37 %
Platelets: 193 10*3/uL (ref 150–450)
RBC: 3.81 x10E6/uL (ref 3.77–5.28)
RDW: 15.8 % — ABNORMAL HIGH (ref 11.7–15.4)
WBC: 3.4 10*3/uL (ref 3.4–10.8)

## 2019-12-31 LAB — LIPID PANEL
Chol/HDL Ratio: 3.8 ratio (ref 0.0–4.4)
Cholesterol, Total: 273 mg/dL — ABNORMAL HIGH (ref 100–199)
HDL: 72 mg/dL (ref >39)
LDL Chol Calc (NIH): 170 mg/dL — ABNORMAL HIGH (ref 0–99)
Triglycerides: 170 mg/dL — ABNORMAL HIGH (ref 0–149)
VLDL Cholesterol Cal: 31 mg/dL (ref 5–40)

## 2019-12-31 LAB — TSH: TSH: 2.48 u[IU]/mL (ref 0.450–4.500)

## 2020-01-01 LAB — URINE CULTURE

## 2020-01-06 ENCOUNTER — Telehealth: Payer: Self-pay

## 2020-01-06 ENCOUNTER — Telehealth: Payer: Self-pay | Admitting: Family Medicine

## 2020-01-06 NOTE — Telephone Encounter (Signed)
-----   Message from Jerrol Banana., MD sent at 01/05/2020  2:59 PM EDT ----- Labs stable.

## 2020-01-06 NOTE — Telephone Encounter (Signed)
Went over labs with patient. Patient verbalizes understanding and is in agreement with plan.

## 2020-01-06 NOTE — Telephone Encounter (Signed)
Patient would like the nurse or doctor to call her to go over her lab results.  She stated that she has some questions.  CB# [phone] (

## 2020-01-06 NOTE — Telephone Encounter (Signed)
Patient advised of lab results through mychart and has read the providers comments.  °

## 2020-01-17 ENCOUNTER — Ambulatory Visit
Admission: RE | Admit: 2020-01-17 | Discharge: 2020-01-17 | Disposition: A | Discharge status: Home / Self Care | Payer: Medicare PPO | Source: Ambulatory Visit | Attending: Family Medicine | Admitting: Family Medicine

## 2020-01-17 ENCOUNTER — Other Ambulatory Visit: Payer: Self-pay

## 2020-01-17 DIAGNOSIS — Z1231 Encounter for screening mammogram for malignant neoplasm of breast: Secondary | ICD-10-CM | POA: Insufficient documentation

## 2020-01-17 DIAGNOSIS — Z853 Personal history of malignant neoplasm of breast: Secondary | ICD-10-CM | POA: Diagnosis not present

## 2020-02-21 ENCOUNTER — Other Ambulatory Visit: Payer: Self-pay | Admitting: Family Medicine

## 2020-02-21 NOTE — Telephone Encounter (Signed)
Requested Prescriptions  Pending Prescriptions Disp Refills  . omeprazole (PRILOSEC) 20 MG capsule [Pharmacy Med Name: OMEPRAZOLE DR 20 MG CAP] 180 capsule 0    Sig: TAKE 1 CAPSULE TWICE DAILY AS NEEDED     Gastroenterology: Proton Pump Inhibitors Passed - 02/21/2020  1:54 PM      Passed - Valid encounter within last 12 months    Recent Outpatient Visits          1 month ago Encounter for Medicare annual wellness exam   [facility] Jerrol Banana., MD   4 months ago [place] Jerrol Banana., MD   1 year ago Annual physical exam   [facility] Jerrol Banana., MD   2 years ago [place] Jerrol Banana., MD   3 years ago Right ear pain   [facility] Trin[place] Post, [place]      Future Appointments            In 4 months Jerrol Banana., MD [facility], [place]

## 2020-03-14 ENCOUNTER — Other Ambulatory Visit: Payer: Self-pay | Admitting: Family Medicine

## 2020-03-14 DIAGNOSIS — F5101 Primary insomnia: Secondary | ICD-10-CM

## 2020-04-03 IMAGING — MG MM DIGITAL SCREENING BILAT W/ TOMO W/ CAD
6 of 10 series · 6 of 26 positions shown · non-contrast
Comparison: Previous exam(s).

CLINICAL DATA: Screening.

EXAM:
DIGITAL SCREENING BILATERAL MAMMOGRAM WITH TOMO AND CAD

[L CC synth-2D]
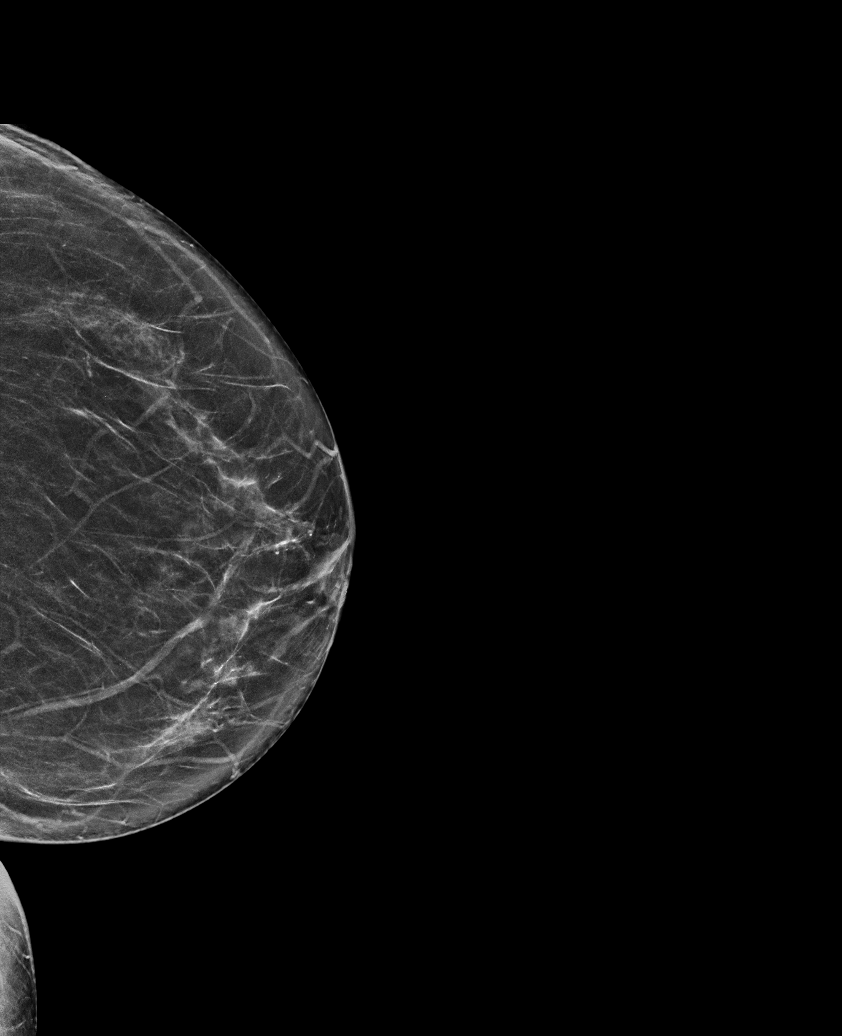

[L MLO synth-2D]
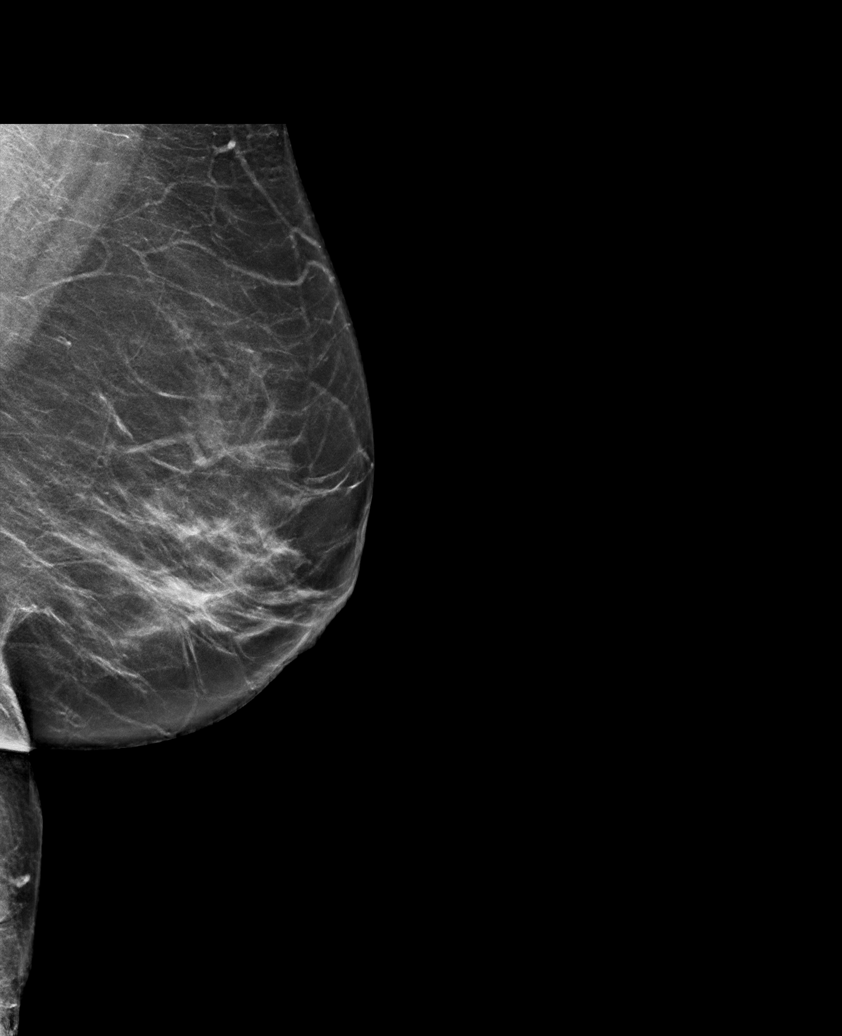

[R MLO synth-2D]
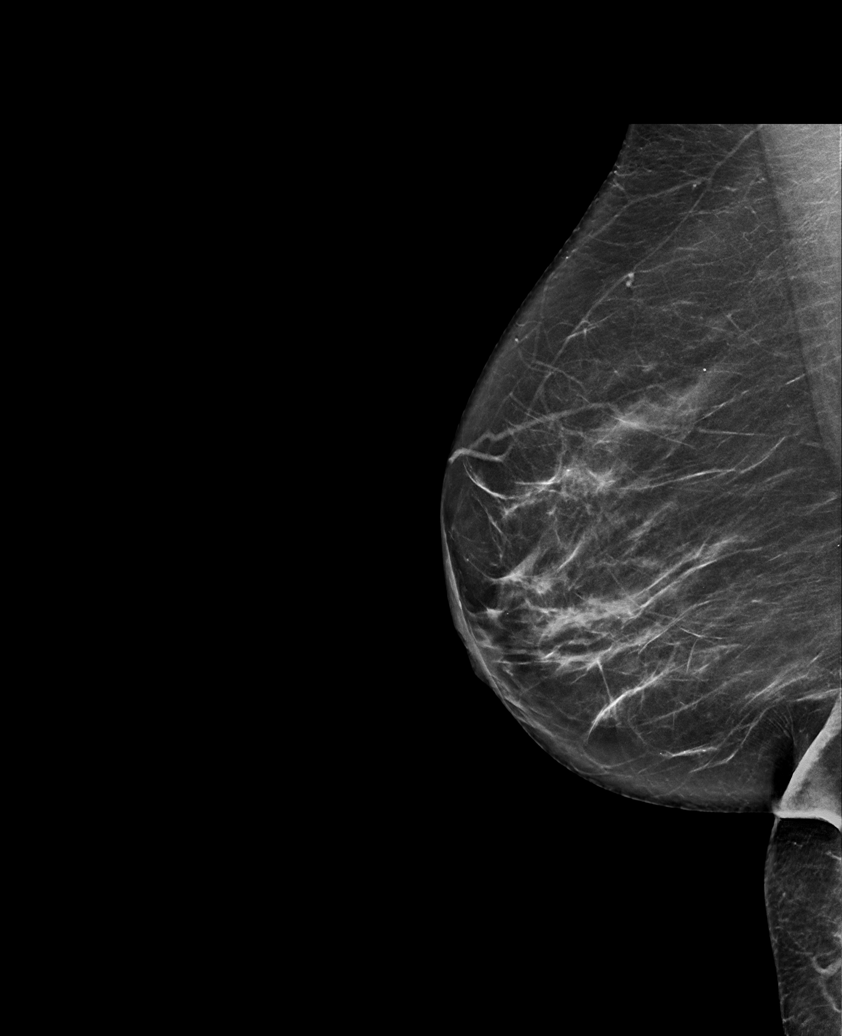

[R CC synth-2D]
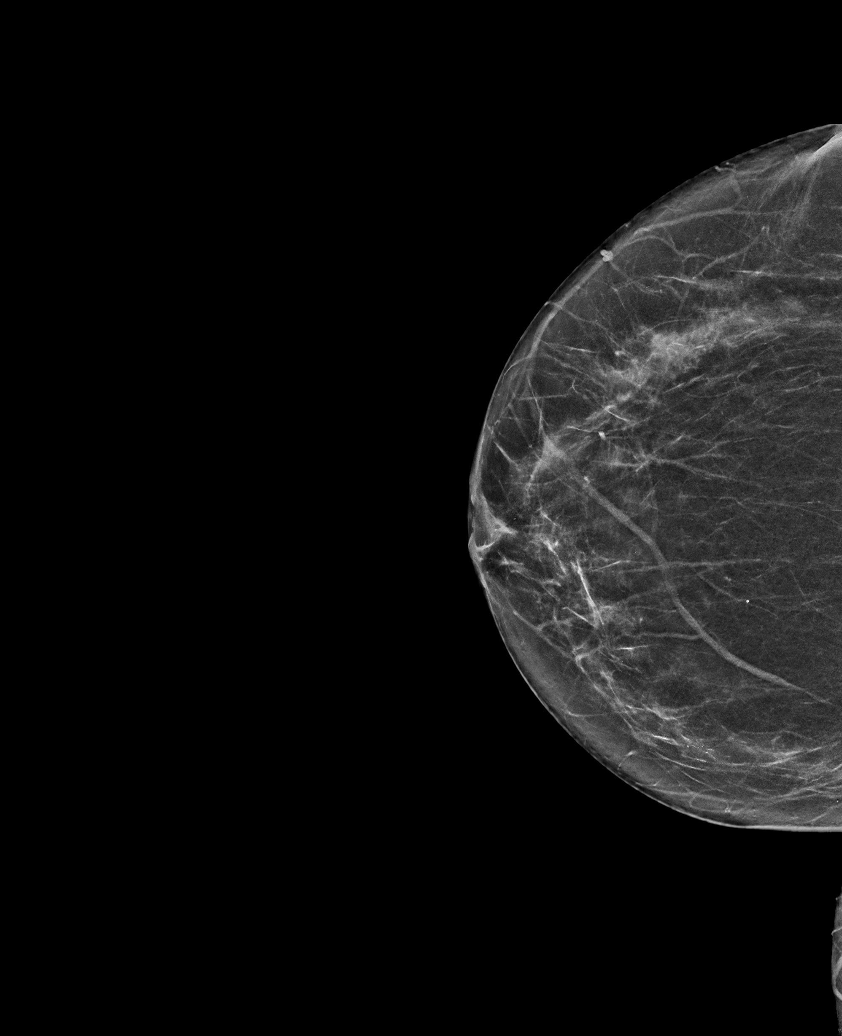

[R CC]
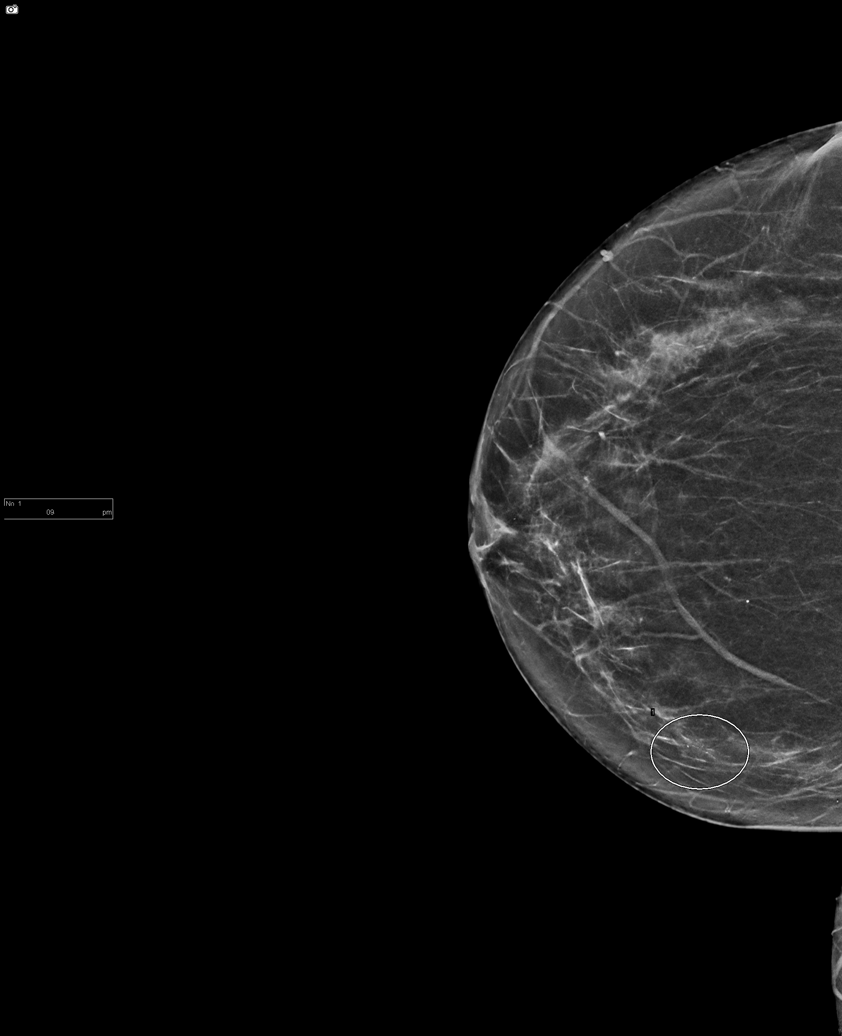

[R MLO]
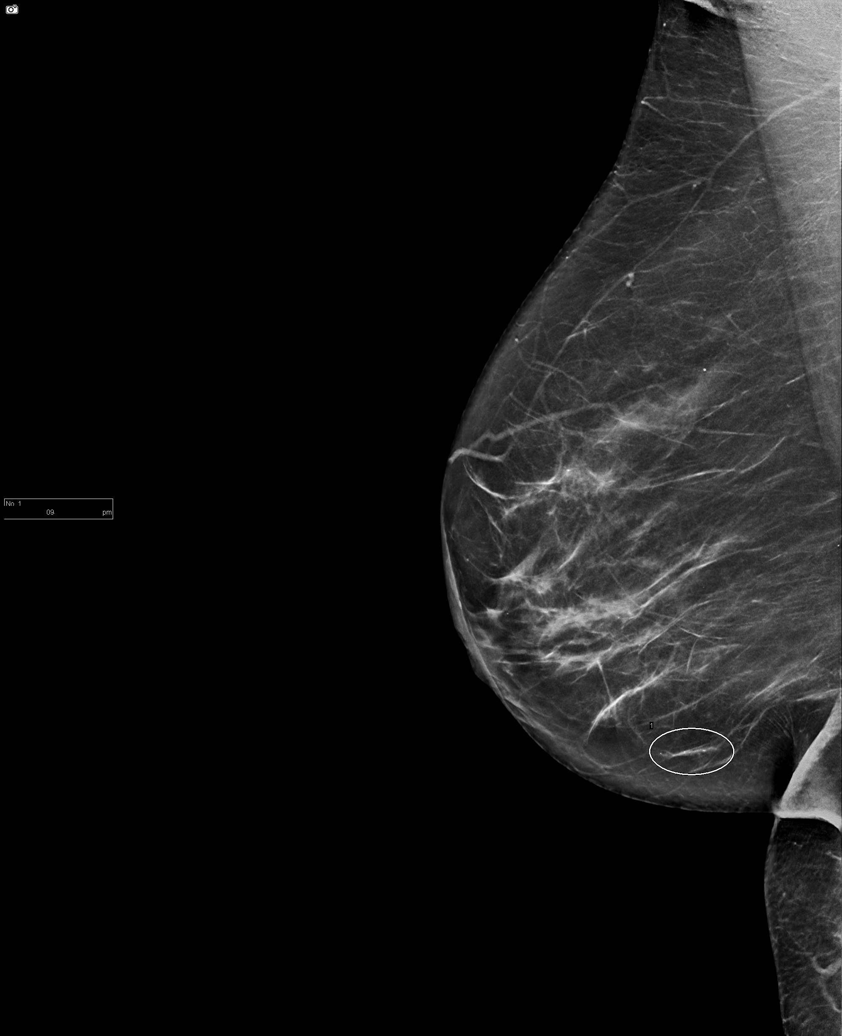

[6 of 26 positions shown; findings below may reference images not displayed]

ACR Breast Density Category b: There are scattered areas of
fibroglandular density.
FINDINGS: In the right breast, calcifications warrant further evaluation with
magnified views. In the left breast, no findings suspicious for
malignancy. Images were processed with CAD.
IMPRESSION: Further evaluation is suggested for calcifications in the right
breast.

RECOMMENDATION:
Diagnostic mammogram of the right breast. (Code:[id number])

The patient will be contacted regarding the findings, and additional
imaging will be scheduled.

BI-RADS CATEGORY  0: Incomplete. Need additional imaging evaluation
and/or prior mammograms for comparison.

## 2020-04-18 ENCOUNTER — Other Ambulatory Visit: Payer: Self-pay | Admitting: Family Medicine

## 2020-04-18 DIAGNOSIS — F419 Anxiety disorder, unspecified: Secondary | ICD-10-CM

## 2020-04-18 NOTE — Telephone Encounter (Signed)
Requested medication (s) are due for refill today: yes  Requested medication (s) are on the active medication list: yes  Last refill:  10/11/19  Future visit scheduled: yes  Notes to clinic:  med not delegated to NT to RF   Requested Prescriptions  Pending Prescriptions Disp Refills   ALPRAZolam (XANAX) 0.5 MG tablet [Pharmacy Med Name: ALPRAZOLAM 0.5 MG TAB] 60 tablet     Sig: TAKE 1 TABLET BY MOUTH TWICE A DAY AS NEEDED FOR ANXIETY.      Not Delegated - Psychiatry:  Anxiolytics/Hypnotics Failed - 04/18/2020 11:53 AM      Failed - This refill cannot be delegated      Failed - Urine Drug Screen completed in last 360 days      Passed - Valid encounter within last 6 months    Recent Outpatient Visits           3 months ago Encounter for Medicare annual wellness exam   [facility] Jerrol Banana., MD   6 months ago [place] Jerrol Banana., MD   1 year ago Annual physical exam   [facility] Jerrol Banana., MD   2 years ago [place] Jerrol Banana., MD   3 years ago Right ear pain   [facility] [place], [place]       Future Appointments             In 2 months Jerrol Banana., MD [facility], [place]

## 2020-04-29 HISTORY — PX: WRIST SURGERY: SHX841

## 2020-05-03 ENCOUNTER — Ambulatory Visit: Payer: Medicare PPO | Admitting: Radiation Oncology

## 2020-05-30 ENCOUNTER — Other Ambulatory Visit: Payer: Self-pay | Admitting: Family Medicine

## 2020-05-30 DIAGNOSIS — F5101 Primary insomnia: Secondary | ICD-10-CM

## 2020-05-30 NOTE — Telephone Encounter (Signed)
Requested medication (s) are due for refill today: no  Requested medication (s) are on the active medication list: yes  Last refill: 03/14/2020  Future visit scheduled: no  Notes to clinic: this refill cannot be delegated    Requested Prescriptions  Pending Prescriptions Disp Refills   zolpidem (AMBIEN) 10 MG tablet [Pharmacy Med Name: ZOLPIDEM TARTRATE 10 MG TAB] 30 tablet     Sig: TAKE ONE-HALF TABLET BY MOUTH AT BEDTIME.      Not Delegated - Psychiatry:  Anxiolytics/Hypnotics Failed - 05/30/2020  9:43 AM      Failed - This refill cannot be delegated      Failed - Urine Drug Screen completed in last 360 days      Passed - Valid encounter within last 6 months    Recent Outpatient Visits           5 months ago Encounter for [employer] annual wellness exam   [facility] Jerrol Banana., MD   7 months ago [place] Jerrol Banana., MD   1 year ago Annual physical exam   [facility] Jerrol Banana., MD   3 years ago [place] Jerrol Banana., MD   3 years ago Right ear pain   [facility] Trinna Post, [place]       Future Appointments             In 4 weeks Jerrol Banana., MD [facility], [place]

## 2020-06-28 ENCOUNTER — Ambulatory Visit: Payer: Self-pay | Admitting: Family Medicine

## 2020-06-30 NOTE — Progress Notes (Signed)
I,April Miller,acting as a scribe for Wilhemena Durie, MD.,have documented all relevant documentation on the behalf of Wilhemena Durie, MD,as directed by  Wilhemena Durie, MD while in the presence of Wilhemena Durie, MD.   Established patient visit   [name]   DOB: 01/29/1946   75 y.o. Female  MRN: [id number] Visit Date: 07/03/2020  Today's healthcare provider: Wilhemena Durie, MD   Chief Complaint  Patient presents with  . Follow-up   Subjective    HPI  Patient is in today for follow-up.  She has had de Quervain's tendinitis on the left and chronic back pain.  She is taking Osteo Bi-Flex with turmeric but it hurts her stomach a little bit.  Her allergies are flared up and she wonders about changing Zyrtec to Xyzal. She has chronic mild anxiety and insomnia and is taking both Xanax and Ambien although she states she takes the Ambien infrequently.  I told her I think it is wise to not continue both of these.  She wishes to give up the Ambien. She states she needs omeprazole for her reflux. Lipid/Cholesterol, follow-up  Last Lipid Panel: Lab Results  Component Value Date   CHOL 273 (H) 12/30/2019   LDLCALC 170 (H) 12/30/2019   HDL 72 12/30/2019   TRIG 170 (H) 12/30/2019    She was last seen for this 6 months ago.  Management since that visit includes; labs checked showing-normal.  She reports good compliance with treatment. She is not having side effects. none She is following a Regular diet. Current exercise: walking  Last metabolic panel Lab Results  Component Value Date   GLUCOSE 91 12/30/2019   NA 140 12/30/2019   K 4.5 12/30/2019   BUN 22 12/30/2019   CREATININE 0.88 12/30/2019   GFRNONAA 65 12/30/2019   GFRAA 75 12/30/2019   CALCIUM 9.4 12/30/2019   AST 16 12/30/2019   ALT 22 12/30/2019   The 10-year ASCVD risk score Mikey Bussing DC Jr., et al., 2013) is:  15.7%  --------------------------------------------------------------------       Medications: Outpatient Medications Prior to Visit  Medication Sig  . acetaminophen (TYLENOL) 500 MG tablet Take 500 mg by mouth every 6 (six) hours as needed for moderate pain or headache.   . ALPRAZolam (XANAX) 0.5 MG tablet TAKE 1 TABLET BY MOUTH TWICE A DAY AS NEEDED FOR ANXIETY.  Marland Kitchen CALCIUM-VITAMIN D PO Take 1 tablet by mouth daily.   . cetirizine (ZYRTEC) 10 MG tablet Take 10 mg by mouth daily as needed for allergies.   . meloxicam (MOBIC) 15 MG tablet Take 15 mg by mouth daily.  . Nutritional Supplements (OSTEO ADVANCE PO) Take by mouth.  Marland Kitchen omeprazole (PRILOSEC) 20 MG capsule TAKE 1 CAPSULE TWICE DAILY AS NEEDED  . Polyethyl Glycol-Propyl Glycol 0.4-0.3 % SOLN Place 1-2 drops into both eyes 3 (three) times daily as needed (for dry eyes).  Marland Kitchen zolpidem (AMBIEN) 10 MG tablet TAKE ONE-HALF TABLET BY MOUTH AT BEDTIME.   No facility-administered medications prior to visit.    Review of Systems  Constitutional: Negative for appetite change, chills, fatigue and fever.  Respiratory: Negative for chest tightness and shortness of breath.   Cardiovascular: Negative for chest pain and palpitations.  Gastrointestinal: Negative for abdominal pain, nausea and vomiting.  Neurological: Negative for dizziness and weakness.        Objective    BP 139/79 (BP Location: Left Arm, Patient Position: Sitting, Cuff Size: Normal)  Pulse 83   Temp 98.1 F (36.7 C) (Oral)   Resp 16   Ht 5\' 2"  (1.575 m)   Wt 131 lb (59.4 kg)   SpO2 97%   BMI 23.96 kg/m  BP Readings from Last 3 Encounters:  07/03/20 139/79  12/30/19 136/76  10/11/19 (!) 156/76   Wt Readings from Last 3 Encounters:  07/03/20 131 lb (59.4 kg)  12/30/19 130 lb (59 kg)  10/11/19 131 lb (59.4 kg)       Physical Exam Vitals reviewed.  Constitutional:      Appearance: She is well-developed.  HENT:     Head: Normocephalic and atraumatic.      Right Ear: External ear normal.     Left Ear: External ear normal.     Nose: Nose normal.  Eyes:     Conjunctiva/sclera: Conjunctivae normal.     Pupils: Pupils are equal, round, and reactive to light.  Cardiovascular:     Rate and Rhythm: Normal rate and regular rhythm.     Heart sounds: Normal heart sounds.  Pulmonary:     Effort: Pulmonary effort is normal.     Breath sounds: Normal breath sounds.  Abdominal:     General: Bowel sounds are normal.     Palpations: Abdomen is soft.  Musculoskeletal:        General: Normal range of motion.     Cervical back: Normal range of motion and neck supple.  Skin:    General: Skin is warm and dry.  Neurological:     Mental Status: She is alert and oriented to person, place, and time.  Psychiatric:        Behavior: Behavior normal.        Thought Content: Thought content normal.        Judgment: Judgment normal.       No results found for any visits on 07/03/20.  Assessment & Plan     1. Gastroesophageal reflux disease, unspecified whether esophagitis present Continue omeprazole. Please schedule BMD.  2. Osteoarthritis, unspecified osteoarthritis type, unspecified site Osteo Bi-Flex without turmeric.  Decrease meloxicam to 7.5 mg daily at her age and size.  About asked her to cut back on use of meloxicam also.  3. Anxiety Chronic issue that is improved.  Continue Xanax as needed but will eliminate Ambien  4. Primary insomnia Do not refill Ambien  5. Hypercholesteremia Follow-up labs.   No follow-ups on file.      I, Wilhemena Durie, MD, have reviewed all documentation for this visit. The documentation on 07/04/20 for the exam, diagnosis, procedures, and orders are all accurate and complete.    Ai Sonnenfeld Cranford Mon, MD  [facility] [phone] (phone) [phone] (fax)  [place]

## 2020-07-03 ENCOUNTER — Encounter: Payer: Self-pay | Admitting: Family Medicine

## 2020-07-03 ENCOUNTER — Other Ambulatory Visit: Payer: Self-pay

## 2020-07-03 ENCOUNTER — Ambulatory Visit: Payer: Medicare PPO | Admitting: Family Medicine

## 2020-07-03 VITALS — BP 139/79 | HR 83 | Temp 98.1°F | Resp 16 | Ht 62.0 in | Wt 131.0 lb

## 2020-07-03 DIAGNOSIS — F5101 Primary insomnia: Secondary | ICD-10-CM

## 2020-07-03 DIAGNOSIS — M1990 Unspecified osteoarthritis, unspecified site: Secondary | ICD-10-CM | POA: Diagnosis not present

## 2020-07-03 DIAGNOSIS — F419 Anxiety disorder, unspecified: Secondary | ICD-10-CM | POA: Diagnosis not present

## 2020-07-03 DIAGNOSIS — K219 Gastro-esophageal reflux disease without esophagitis: Secondary | ICD-10-CM

## 2020-07-03 DIAGNOSIS — E7800 Pure hypercholesterolemia, unspecified: Secondary | ICD-10-CM

## 2020-07-03 NOTE — Patient Instructions (Signed)
Look for piliates/yoga/ Tai Chi to help with back pain.   Try Osteo Biflex over the counter.   Ok to change Zyrtec to Xyzal once daily.

## 2020-07-31 ENCOUNTER — Other Ambulatory Visit: Payer: Self-pay | Admitting: Family Medicine

## 2020-07-31 DIAGNOSIS — F419 Anxiety disorder, unspecified: Secondary | ICD-10-CM

## 2020-07-31 NOTE — Telephone Encounter (Signed)
Requested Prescriptions  Pending Prescriptions Disp Refills  . omeprazole (PRILOSEC) 20 MG capsule [Pharmacy Med Name: OMEPRAZOLE DR 20 MG CAP] 180 capsule 0    Sig: TAKE 1 CAPSULE TWICE DAILY AS NEEDED     Gastroenterology: Proton Pump Inhibitors Passed - 07/31/2020 11:40 AM      Passed - Valid encounter within last 12 months    Recent Outpatient Visits          4 weeks ago Osteoarthritis, unspecified osteoarthritis type, unspecified site   [facility] Jerrol Banana., MD   7 months ago Encounter for [employer] annual wellness exam   [facility] Jerrol Banana., MD   9 months ago [place] Jerrol Banana., MD   1 year ago Annual physical exam   [facility] Jerrol Banana., MD   3 years ago [place] Jerrol Banana., MD      Future Appointments            In 5 months Jerrol Banana., MD [facility], [place]

## 2020-07-31 NOTE — Telephone Encounter (Signed)
Requested medication (s) are due for refill today: yes  Requested medication (s) are on the active medication list: yes  Last refill:  06/20/2020  Future visit scheduled: yes  Notes to clinic:  this refill cannot be delegated    Requested Prescriptions  Pending Prescriptions Disp Refills   ALPRAZolam (XANAX) 0.5 MG tablet [Pharmacy Med Name: ALPRAZOLAM 0.5 MG TAB] 60 tablet     Sig: TAKE ONE TABLET BY MOUTH TWICE DAILY AS NEEDED FOR ANXIETY      Not Delegated - Psychiatry:  Anxiolytics/Hypnotics Failed - 07/31/2020  9:07 AM      Failed - This refill cannot be delegated      Failed - Urine Drug Screen completed in last 360 days      Passed - Valid encounter within last 6 months    Recent Outpatient Visits           4 weeks ago Osteoarthritis, unspecified osteoarthritis type, unspecified site   [facility] Jerrol Banana., MD   7 months ago Encounter for [employer] annual wellness exam   [facility] Jerrol Banana., MD   9 months ago Hypercholesteremia   [facility] Jerrol Banana., MD   1 year ago Annual physical exam   [facility] Jerrol Banana., MD   3 years ago [place] Jerrol Banana., MD       Future Appointments             In 5 months Jerrol Banana., MD [facility] [place]

## 2020-09-13 DIAGNOSIS — Z4789 Encounter for other orthopedic aftercare: Secondary | ICD-10-CM | POA: Diagnosis not present

## 2020-09-13 DIAGNOSIS — M654 Radial styloid tenosynovitis [de Quervain]: Secondary | ICD-10-CM | POA: Diagnosis not present

## 2020-09-20 DIAGNOSIS — Z4789 Encounter for other orthopedic aftercare: Secondary | ICD-10-CM | POA: Diagnosis not present

## 2020-09-20 DIAGNOSIS — M654 Radial styloid tenosynovitis [de Quervain]: Secondary | ICD-10-CM | POA: Diagnosis not present

## 2020-09-28 DIAGNOSIS — M1712 Unilateral primary osteoarthritis, left knee: Secondary | ICD-10-CM | POA: Diagnosis not present

## 2020-09-28 DIAGNOSIS — M170 Bilateral primary osteoarthritis of knee: Secondary | ICD-10-CM | POA: Diagnosis not present

## 2020-10-03 DIAGNOSIS — M25632 Stiffness of left wrist, not elsewhere classified: Secondary | ICD-10-CM | POA: Diagnosis not present

## 2020-10-06 DIAGNOSIS — M5416 Radiculopathy, lumbar region: Secondary | ICD-10-CM | POA: Diagnosis not present

## 2020-10-17 DIAGNOSIS — M25632 Stiffness of left wrist, not elsewhere classified: Secondary | ICD-10-CM | POA: Diagnosis not present

## 2020-10-19 DIAGNOSIS — M545 Low back pain, unspecified: Secondary | ICD-10-CM | POA: Diagnosis not present

## 2020-11-01 DIAGNOSIS — M545 Low back pain, unspecified: Secondary | ICD-10-CM | POA: Diagnosis not present

## 2020-11-03 DIAGNOSIS — M545 Low back pain, unspecified: Secondary | ICD-10-CM | POA: Diagnosis not present

## 2020-11-06 DIAGNOSIS — M545 Low back pain, unspecified: Secondary | ICD-10-CM | POA: Diagnosis not present

## 2020-11-08 ENCOUNTER — Other Ambulatory Visit: Payer: Self-pay | Admitting: Family Medicine

## 2020-11-08 DIAGNOSIS — F5101 Primary insomnia: Secondary | ICD-10-CM

## 2020-11-09 DIAGNOSIS — M545 Low back pain, unspecified: Secondary | ICD-10-CM | POA: Diagnosis not present

## 2020-11-13 DIAGNOSIS — M545 Low back pain, unspecified: Secondary | ICD-10-CM | POA: Diagnosis not present

## 2020-11-15 DIAGNOSIS — M545 Low back pain, unspecified: Secondary | ICD-10-CM | POA: Diagnosis not present

## 2020-12-05 ENCOUNTER — Other Ambulatory Visit: Payer: Self-pay | Admitting: Family Medicine

## 2020-12-05 DIAGNOSIS — Z1231 Encounter for screening mammogram for malignant neoplasm of breast: Secondary | ICD-10-CM

## 2020-12-22 DIAGNOSIS — M25562 Pain in left knee: Secondary | ICD-10-CM | POA: Diagnosis not present

## 2021-01-09 ENCOUNTER — Other Ambulatory Visit: Payer: Self-pay

## 2021-01-09 ENCOUNTER — Encounter: Payer: Self-pay | Admitting: Family Medicine

## 2021-01-09 ENCOUNTER — Other Ambulatory Visit: Payer: Self-pay | Admitting: Family Medicine

## 2021-01-09 ENCOUNTER — Ambulatory Visit (INDEPENDENT_AMBULATORY_CARE_PROVIDER_SITE_OTHER): Payer: Medicare PPO | Admitting: Family Medicine

## 2021-01-09 VITALS — BP 144/76 | HR 76 | Temp 98.6°F | Resp 16 | Ht 62.0 in | Wt 130.0 lb

## 2021-01-09 DIAGNOSIS — M1990 Unspecified osteoarthritis, unspecified site: Secondary | ICD-10-CM

## 2021-01-09 DIAGNOSIS — E7800 Pure hypercholesterolemia, unspecified: Secondary | ICD-10-CM

## 2021-01-09 DIAGNOSIS — K219 Gastro-esophageal reflux disease without esophagitis: Secondary | ICD-10-CM

## 2021-01-09 DIAGNOSIS — R000 Tachycardia, unspecified: Secondary | ICD-10-CM

## 2021-01-09 DIAGNOSIS — R7989 Other specified abnormal findings of blood chemistry: Secondary | ICD-10-CM

## 2021-01-09 DIAGNOSIS — R350 Frequency of micturition: Secondary | ICD-10-CM | POA: Diagnosis not present

## 2021-01-09 DIAGNOSIS — Z0000 Encounter for general adult medical examination without abnormal findings: Secondary | ICD-10-CM

## 2021-01-09 LAB — POCT URINALYSIS DIPSTICK
Bilirubin, UA: NEGATIVE
Glucose, UA: NEGATIVE
Ketones, UA: NEGATIVE
Nitrite, UA: NEGATIVE
Protein, UA: NEGATIVE
Spec Grav, UA: 1.02 (ref 1.010–1.025)
Urobilinogen, UA: 0.2 E.U./dL
pH, UA: 7.5 (ref 5.0–8.0)

## 2021-01-09 MED ORDER — NITROFURANTOIN MONOHYD MACRO 100 MG PO CAPS: 100.0000 mg | ORAL_CAPSULE | Freq: Two times a day (BID) | ORAL | 0 refills | Status: AC

## 2021-01-09 MED ORDER — CETIRIZINE HCL 10 MG PO TABS: 10.0000 mg | ORAL_TABLET | Freq: Every day | ORAL | 11 refills | Status: DC | PRN

## 2021-01-09 NOTE — Progress Notes (Signed)
Annual Wellness Visit     [name], Female    DOB: 02/04/1946, 75 y.o.   MRN: [id number] Visit Date: 01/09/2021  Today's Provider: Wilhemena Durie, MD   Chief Complaint  Patient presents with   Annual Exam   Subjective    Sherri Cisneros is a 75 y.o. female who presents today for her Annual Wellness Visit.Annual Physical. She reports consuming a general diet. Home exercise routine includes walking. She generally feels well. She reports sleeping well. She does have additional problems to discuss today.   Patient mentions that she has had burning and urinary frequency X 1 week.   She also mentions that she has had "hot flashes" that have worsened.  Actually when she is describes a hot flashes she describes a racing heart followed by a some mild diaphoresis.  No syncope or presyncope.  She complains of chronic left knee left thumb and back pain. Medications: Outpatient Medications Prior to Visit  Medication Sig   acetaminophen (TYLENOL) 500 MG tablet Take 500 mg by mouth every 6 (six) hours as needed for moderate pain or headache.    ALPRAZolam (XANAX) 0.5 MG tablet TAKE ONE TABLET BY MOUTH TWICE DAILY AS NEEDED FOR ANXIETY   CALCIUM-VITAMIN D PO Take 1 tablet by mouth daily.    cetirizine (ZYRTEC) 10 MG tablet Take 10 mg by mouth daily as needed for allergies.    meloxicam (MOBIC) 15 MG tablet Take 15 mg by mouth daily.   Nutritional Supplements (OSTEO ADVANCE PO) Take by mouth.   omeprazole (PRILOSEC) 20 MG capsule TAKE 1 CAPSULE TWICE DAILY AS NEEDED   Polyethyl Glycol-Propyl Glycol 0.4-0.3 % SOLN Place 1-2 drops into both eyes 3 (three) times daily as needed (for dry eyes).   zolpidem (AMBIEN) 10 MG tablet TAKE ONE-HALF TABLET BY MOUTH AT BEDTIME.   No facility-administered medications prior to visit.    Allergies  Allergen Reactions   Actonel [Risedronate Sodium] Other (See Comments)    GI side effects   Alendronate Other (See Comments)    GI side  effects   Clarithromycin Other (See Comments)    Unknown   Erythromycin Other (See Comments)    Jaundice;  CAN TAKE MACROBID PER PATIENT   Sulfa Antibiotics Other (See Comments)    Unknown   Bactrim [Sulfamethoxazole-Trimethoprim] Rash   Grapeseed Extract [Nutritional Supplements] Rash    Some raw fruits cause mouth to "break out"    Patient Care Team: Jerrol Banana., MD as PCP - General (Family Medicine) Arelia Sneddon, OD as Consulting Physician (Optometry) [place], Romilda Garret, DPM as Consulting Physician (Podiatry) Jannet Mantis, MD as Consulting Physician (Dermatology)  Review of Systems  Constitutional:  Positive for activity change and fatigue.  Eyes: Negative.   Respiratory:  Negative for cough and shortness of breath.   Cardiovascular:  Negative for chest pain, palpitations and leg swelling.  Endocrine: Negative.   Genitourinary:  Positive for dysuria, frequency and urgency.  Musculoskeletal:  Positive for arthralgias and myalgias.  Neurological:  Negative for dizziness and headaches.  Psychiatric/Behavioral:  Negative for self-injury, sleep disturbance and suicidal ideas. The patient is nervous/anxious.   All other systems reviewed and are negative.       Objective    Vitals: BP (!) 144/76   Pulse 76   Temp 98.6 F (37 C)   Resp 16   Ht '5\' 2"'$  (1.575 m)   Wt 130 lb (59 kg)   BMI 23.78  kg/m  BP Readings from Last 3 Encounters:  01/09/21 (!) 144/76  07/03/20 139/79  12/30/19 136/76   Wt Readings from Last 3 Encounters:  01/09/21 130 lb (59 kg)  07/03/20 131 lb (59.4 kg)  12/30/19 130 lb (59 kg)      Physical Exam Vitals reviewed.  Constitutional:      Appearance: She is well-developed.  HENT:     Head: Normocephalic and atraumatic.     Right Ear: External ear normal.     Left Ear: External ear normal.     Nose: Nose normal.  Eyes:     Conjunctiva/sclera: Conjunctivae normal.     Pupils: Pupils are equal, round, and reactive to light.   Cardiovascular:     Rate and Rhythm: Normal rate and regular rhythm.     Heart sounds: Normal heart sounds.  Pulmonary:     Effort: Pulmonary effort is normal.     Breath sounds: Normal breath sounds.  Abdominal:     General: Bowel sounds are normal.     Palpations: Abdomen is soft.  Genitourinary:    Vagina: No vaginal discharge.     Rectum: Guaiac result negative.  Musculoskeletal:     Cervical back: Normal range of motion and neck supple.     Right lower leg: No edema.     Left lower leg: No edema.  Skin:    General: Skin is warm and dry.  Neurological:     Mental Status: She is alert and oriented to person, place, and time.  Psychiatric:        Behavior: Behavior normal.        Thought Content: Thought content normal.        Judgment: Judgment normal.   ECG reveals sinus rhythm with nonspecific ST-T wave changes anteriorly  Most recent functional status assessment: In your present state of health, do you have any difficulty performing the following activities: 07/03/2020  Hearing? N  Vision? N  Difficulty concentrating or making decisions? N  Walking or climbing stairs? Y  Dressing or bathing? N  Doing errands, shopping? N  Some recent data might be hidden   Most recent fall risk assessment: Fall Risk  07/03/2020  Falls in the past year? 0  Number falls in past yr: 0  Injury with Fall? 0  Follow up Falls evaluation completed    Most recent depression screenings: PHQ 2/9 Scores 07/03/2020 12/30/2019  PHQ - 2 Score 0 0  PHQ- 9 Score 2 -   Most recent cognitive screening: 6CIT Screen 01/09/2021  What Year? 0 points  What month? 0 points  What time? 0 points  Count back from 20 0 points  Months in reverse 0 points  Repeat phrase 0 points  Total Score 0   Most recent Audit-C alcohol use screening Alcohol Use Disorder Test (AUDIT) 07/03/2020  1. How often do you have a drink containing alcohol? 2  2. How many drinks containing alcohol do you have on a typical day  when you are drinking? 0  3. How often do you have six or more drinks on one occasion? 0  AUDIT-C Score 2  Alcohol Brief Interventions/Follow-up AUDIT Score <7 follow-up not indicated   A score of 3 or more in women, and 4 or more in men indicates increased risk for alcohol abuse, EXCEPT if all of the points are from question 1   No results found for any visits on 01/09/21.  Assessment & Plan     Annual  wellness visit done today including the all of the following: Reviewed patient's Family Medical History Reviewed and updated list of patient's medical providers Assessment of cognitive impairment was done Assessed patient's functional ability Established a written schedule for health screening [place] Completed and Reviewed  Exercise Activities and Dietary recommendations  Goals      Increase water intake     Starting 02/29/16, I will increase my water intake to 6 glasses a day.        Immunization History  Administered Date(s) Administered   Fluad Quad(high Dose 65+) 01/12/2019   Influenza, High Dose Seasonal PF 01/04/2015, 02/29/2016, 12/31/2016, 01/24/2018, 02/09/2020   PFIZER(Purple Top)SARS-COV-2 Vaccination 06/09/2019, 06/30/2019   Pneumococcal Conjugate-13 01/04/2015   Pneumococcal Polysaccharide-23 01/21/2012   Td 08/17/2003, 12/21/2010   Tdap 07/02/2011, 03/07/2014   Zoster, Live 08/12/2011    Health Maintenance  Topic Date Due   Zoster Vaccines- Shingrix (1 of 2) Never done   COVID-19 Vaccine (3 - Pfizer risk series) 07/28/2019   INFLUENZA VACCINE  11/27/2020   COLONOSCOPY (Pts 45-30yr Insurance coverage will need to be confirmed)  07/11/2021   MAMMOGRAM  01/16/2022   TETANUS/TDAP  03/07/2024   DEXA SCAN  Completed   Hepatitis C Screening  Completed   PNA vac Low Risk Adult  Completed   HPV VACCINES  Aged Out   1. Encounter for Medicare annual wellness exam   2. Annual physical exam   3. Hypercholesteremia  - Lipid panel -  Comprehensive Metabolic Panel (CMET)  4. Osteoarthritis, unspecified osteoarthritis type, unspecified site   5. Gastroesophageal reflux disease, unspecified whether esophagitis present  - CBC w/Diff/Platelet  6. Elevated TSH  - TSH  7. Urinary frequency  - Urine Culture - nitrofurantoin, macrocrystal-monohydrate, (MACROBID) 100 MG capsule; Take 1 capsule (100 mg total) by mouth 2 (two) times daily for 5 days.  Dispense: 10 capsule; Refill: 0 - POCT urinalysis dipstick  8. Racing heart beat At this time refer to cardiology with her history of palpitations followed by diaphoresis. Diaphoresis could be flashes but I think this is the order that occurs.  For to cardiology. - EKG 12-Lead - Ambulatory referral to Cardiology   Discussed health benefits of physical activity, and encouraged her to engage in regular exercise appropriate for her age and condition.     No follow-ups on file.     I, RWilhemena Durie MD, have reviewed all documentation for this visit. The documentation on 01/14/21 for the exam, diagnosis, procedures, and orders are all accurate and complete.    Roneka Gilpin GCranford Mon MD  BBien[facility] [phone](phone) [phone](fax)  [place]

## 2021-01-10 LAB — CBC WITH DIFFERENTIAL/PLATELET
Basophils Absolute: 0 10*3/uL (ref 0.0–0.2)
Basos: 1 %
EOS (ABSOLUTE): 0.1 10*3/uL (ref 0.0–0.4)
Eos: 3 %
Hematocrit: 34.3 % (ref 34.0–46.6)
Hemoglobin: 11.5 g/dL (ref 11.1–15.9)
Immature Grans (Abs): 0 10*3/uL (ref 0.0–0.1)
Immature Granulocytes: 0 %
Lymphocytes Absolute: 2 10*3/uL (ref 0.7–3.1)
Lymphs: 49 %
MCH: 29.9 pg (ref 26.6–33.0)
MCHC: 33.5 g/dL (ref 31.5–35.7)
MCV: 89 fL (ref 79–97)
Monocytes Absolute: 0.3 10*3/uL (ref 0.1–0.9)
Monocytes: 8 %
Neutrophils Absolute: 1.6 10*3/uL (ref 1.4–7.0)
Neutrophils: 39 %
Platelets: 207 10*3/uL (ref 150–450)
RBC: 3.84 x10E6/uL (ref 3.77–5.28)
RDW: 14.8 % (ref 11.7–15.4)
WBC: 4 10*3/uL (ref 3.4–10.8)

## 2021-01-10 LAB — LIPID PANEL
Chol/HDL Ratio: 3.9 ratio (ref 0.0–4.4)
Cholesterol, Total: 280 mg/dL — ABNORMAL HIGH (ref 100–199)
HDL: 71 mg/dL (ref >39)
LDL Chol Calc (NIH): 178 mg/dL — ABNORMAL HIGH (ref 0–99)
Triglycerides: 173 mg/dL — ABNORMAL HIGH (ref 0–149)
VLDL Cholesterol Cal: 31 mg/dL (ref 5–40)

## 2021-01-10 LAB — COMPREHENSIVE METABOLIC PANEL
ALT: 15 IU/L (ref 0–32)
AST: 16 IU/L (ref 0–40)
Albumin/Globulin Ratio: 2.1 (ref 1.2–2.2)
Albumin: 4.4 g/dL (ref 3.7–4.7)
Alkaline Phosphatase: 51 IU/L (ref 44–121)
BUN/Creatinine Ratio: 17 (ref 12–28)
BUN: 13 mg/dL (ref 8–27)
Bilirubin Total: 0.3 mg/dL (ref 0.0–1.2)
CO2: 23 mmol/L (ref 20–29)
Calcium: 9.6 mg/dL (ref 8.7–10.3)
Chloride: 107 mmol/L — ABNORMAL HIGH (ref 96–106)
Creatinine, Ser: 0.77 mg/dL (ref 0.57–1.00)
Globulin, Total: 2.1 g/dL (ref 1.5–4.5)
Glucose: 97 mg/dL (ref 65–99)
Potassium: 4.4 mmol/L (ref 3.5–5.2)
Sodium: 142 mmol/L (ref 134–144)
Total Protein: 6.5 g/dL (ref 6.0–8.5)
eGFR: 81 mL/min/{1.73_m2} (ref >59)

## 2021-01-10 LAB — TSH: TSH: 2.09 u[IU]/mL (ref 0.450–4.500)

## 2021-01-13 LAB — URINE CULTURE

## 2021-01-15 ENCOUNTER — Other Ambulatory Visit: Payer: Self-pay

## 2021-01-15 ENCOUNTER — Telehealth: Payer: Self-pay

## 2021-01-15 MED ORDER — OMEPRAZOLE 20 MG PO CPDR: 20.0000 mg | DELAYED_RELEASE_CAPSULE | Freq: Every day | ORAL | 0 refills | Status: DC

## 2021-01-15 MED ORDER — LEVOCETIRIZINE DIHYDROCHLORIDE 5 MG PO TABS: 5.0000 mg | ORAL_TABLET | Freq: Every evening | ORAL | 3 refills | Status: AC

## 2021-01-15 NOTE — Telephone Encounter (Signed)
Patient was seen in the office on 01/09/21 and was treated for UTI. She was prescribed Macrobid X 5 days.   She reports that she is still having urinary frequency and a pressure sensation in her bladder. Patient wants to know what else can she take for her symptoms? Please advise. Thanks!

## 2021-01-15 NOTE — Telephone Encounter (Signed)
Refilled meds. Patient requested refills during OV and reports that it was not sent in.

## 2021-01-17 ENCOUNTER — Other Ambulatory Visit: Payer: Self-pay

## 2021-01-17 ENCOUNTER — Ambulatory Visit
Admission: RE | Admit: 2021-01-17 | Discharge: 2021-01-17 | Disposition: A | Discharge status: Home / Self Care | Payer: Medicare PPO | Source: Ambulatory Visit | Attending: Family Medicine | Admitting: Family Medicine

## 2021-01-17 DIAGNOSIS — Z1231 Encounter for screening mammogram for malignant neoplasm of breast: Secondary | ICD-10-CM | POA: Diagnosis not present

## 2021-02-02 ENCOUNTER — Telehealth: Payer: Self-pay | Admitting: Family Medicine

## 2021-02-02 NOTE — Telephone Encounter (Signed)
Done. Copy of EKG faxed.

## 2021-02-02 NOTE — Telephone Encounter (Signed)
Amy from [facility] is calling to request the pt EKG. Pts appt is Monday. Requesting if this can be faxed today.  Fax-[phone] Attention Edd Fabian [phone]

## 2021-02-05 DIAGNOSIS — R002 Palpitations: Secondary | ICD-10-CM | POA: Diagnosis not present

## 2021-02-05 DIAGNOSIS — R0602 Shortness of breath: Secondary | ICD-10-CM | POA: Diagnosis not present

## 2021-02-05 DIAGNOSIS — Z789 Other specified health status: Secondary | ICD-10-CM | POA: Diagnosis not present

## 2021-02-05 DIAGNOSIS — R9431 Abnormal electrocardiogram [ECG] [EKG]: Secondary | ICD-10-CM | POA: Diagnosis not present

## 2021-02-05 DIAGNOSIS — E7800 Pure hypercholesterolemia, unspecified: Secondary | ICD-10-CM | POA: Diagnosis not present

## 2021-02-05 DIAGNOSIS — I10 Essential (primary) hypertension: Secondary | ICD-10-CM | POA: Diagnosis not present

## 2021-02-21 DIAGNOSIS — R002 Palpitations: Secondary | ICD-10-CM | POA: Diagnosis not present

## 2021-02-23 DIAGNOSIS — R0602 Shortness of breath: Secondary | ICD-10-CM | POA: Diagnosis not present

## 2021-02-27 DIAGNOSIS — E7800 Pure hypercholesterolemia, unspecified: Secondary | ICD-10-CM | POA: Diagnosis not present

## 2021-02-27 DIAGNOSIS — I10 Essential (primary) hypertension: Secondary | ICD-10-CM | POA: Diagnosis not present

## 2021-02-27 DIAGNOSIS — R002 Palpitations: Secondary | ICD-10-CM | POA: Diagnosis not present

## 2021-02-27 DIAGNOSIS — R9431 Abnormal electrocardiogram [ECG] [EKG]: Secondary | ICD-10-CM | POA: Diagnosis not present

## 2021-04-30 ENCOUNTER — Other Ambulatory Visit: Payer: Self-pay | Admitting: Family Medicine

## 2021-04-30 DIAGNOSIS — F5101 Primary insomnia: Secondary | ICD-10-CM

## 2021-05-01 NOTE — Telephone Encounter (Signed)
Please advise refill?  LOV: 01/09/2021 NOV: 07/11/2021 Last refill: 11/09/2020 #30 w/1 refill

## 2021-05-02 NOTE — Telephone Encounter (Signed)
Pt called to report that she is almost out of her current supply, please advise. Pt does not want to be without.

## 2021-05-18 DIAGNOSIS — H26493 Other secondary cataract, bilateral: Secondary | ICD-10-CM | POA: Diagnosis not present

## 2021-05-30 DIAGNOSIS — Z789 Other specified health status: Secondary | ICD-10-CM | POA: Diagnosis not present

## 2021-05-30 DIAGNOSIS — R002 Palpitations: Secondary | ICD-10-CM | POA: Diagnosis not present

## 2021-05-30 DIAGNOSIS — I10 Essential (primary) hypertension: Secondary | ICD-10-CM | POA: Diagnosis not present

## 2021-05-30 DIAGNOSIS — E7800 Pure hypercholesterolemia, unspecified: Secondary | ICD-10-CM | POA: Diagnosis not present

## 2021-05-30 DIAGNOSIS — R0602 Shortness of breath: Secondary | ICD-10-CM | POA: Diagnosis not present

## 2021-06-08 DIAGNOSIS — L603 Nail dystrophy: Secondary | ICD-10-CM | POA: Diagnosis not present

## 2021-06-08 DIAGNOSIS — D2262 Melanocytic nevi of left upper limb, including shoulder: Secondary | ICD-10-CM | POA: Diagnosis not present

## 2021-06-08 DIAGNOSIS — D225 Melanocytic nevi of trunk: Secondary | ICD-10-CM | POA: Diagnosis not present

## 2021-06-08 DIAGNOSIS — D2261 Melanocytic nevi of right upper limb, including shoulder: Secondary | ICD-10-CM | POA: Diagnosis not present

## 2021-06-08 DIAGNOSIS — D2272 Melanocytic nevi of left lower limb, including hip: Secondary | ICD-10-CM | POA: Diagnosis not present

## 2021-06-08 DIAGNOSIS — L718 Other rosacea: Secondary | ICD-10-CM | POA: Diagnosis not present

## 2021-06-08 DIAGNOSIS — L821 Other seborrheic keratosis: Secondary | ICD-10-CM | POA: Diagnosis not present

## 2021-06-28 ENCOUNTER — Other Ambulatory Visit: Payer: Self-pay | Admitting: Physician Assistant

## 2021-06-28 DIAGNOSIS — M25562 Pain in left knee: Secondary | ICD-10-CM | POA: Diagnosis not present

## 2021-06-28 DIAGNOSIS — F5101 Primary insomnia: Secondary | ICD-10-CM

## 2021-06-28 NOTE — Telephone Encounter (Signed)
Requested medication (s) are due for refill today: yes ? ?Requested medication (s) are on the active medication list: yes ? ?Last refill:  05/02/21 #30/0 ? ?Future visit scheduled: yes ? ?Notes to clinic:  Unable to refill per protocol, cannot delegate. ? ?  ?Requested Prescriptions  ?Pending Prescriptions Disp Refills  ? zolpidem (AMBIEN) 10 MG tablet [Pharmacy Med Name: ZOLPIDEM TARTRATE 10 MG TAB] 30 tablet   ?  Sig: TAKE ONE-HALF TABLET BY MOUTH AT BEDTIME.  ?  ? Not Delegated - Psychiatry:  Anxiolytics/Hypnotics Failed - 06/28/2021 10:59 AM  ?  ?  Failed - This refill cannot be delegated  ?  ?  Failed - Urine Drug Screen completed in last 360 days  ?  ?  Passed - Valid encounter within last 6 months  ?  Recent Outpatient Visits   ? ?      ? 5 months ago Encounter for [employer] annual wellness exam  ? [facility] Jerrol Banana., MD  ? 12 months ago Osteoarthritis, unspecified osteoarthritis type, unspecified site  ? [facility] Jerrol Banana., MD  ? 1 year ago Encounter for Medicare annual wellness exam  ? [facility] Jerrol Banana., MD  ? 1 year ago Hypercholesteremia  ? [facility] Jerrol Banana., MD  ? 2 years ago Annual physical exam  ? [facility] Jerrol Banana., MD  ? ?  ?  ?Future Appointments   ? ?        ? In 1 week Jerrol Banana., MD [facility]  ? ?  ? ?  ?  ?  ? ?

## 2021-07-02 ENCOUNTER — Other Ambulatory Visit: Payer: Self-pay

## 2021-07-02 DIAGNOSIS — F5101 Primary insomnia: Secondary | ICD-10-CM

## 2021-07-02 NOTE — Telephone Encounter (Signed)
Copied from [place]. Topic: General - Other ?>> Jul 02, 2021  1:51 PM Tessa Lerner A wrote: ?Reason for CRM: The patient would like to speak with a member of staff when possible  ? ?The patient has previously requested zolpidem (AMBIEN) 10 MG tablet [[id number]] ? ?The patient would like to speak with a member of clinical staff when possible about the requested refill  ? ?Please contact further when available ?

## 2021-07-11 ENCOUNTER — Encounter: Payer: Self-pay | Admitting: Family Medicine

## 2021-07-11 ENCOUNTER — Ambulatory Visit: Payer: Medicare PPO | Admitting: Family Medicine

## 2021-07-11 ENCOUNTER — Other Ambulatory Visit: Payer: Self-pay

## 2021-07-11 VITALS — BP 134/87 | HR 86 | Temp 97.0°F | Resp 16 | Ht 62.0 in | Wt 124.0 lb

## 2021-07-11 DIAGNOSIS — M25562 Pain in left knee: Secondary | ICD-10-CM | POA: Diagnosis not present

## 2021-07-11 DIAGNOSIS — D0511 Intraductal carcinoma in situ of right breast: Secondary | ICD-10-CM | POA: Diagnosis not present

## 2021-07-11 DIAGNOSIS — G8929 Other chronic pain: Secondary | ICD-10-CM

## 2021-07-11 DIAGNOSIS — F5101 Primary insomnia: Secondary | ICD-10-CM

## 2021-07-11 DIAGNOSIS — I2510 Atherosclerotic heart disease of native coronary artery without angina pectoris: Secondary | ICD-10-CM | POA: Diagnosis not present

## 2021-07-11 DIAGNOSIS — R7989 Other specified abnormal findings of blood chemistry: Secondary | ICD-10-CM | POA: Diagnosis not present

## 2021-07-11 DIAGNOSIS — E7800 Pure hypercholesterolemia, unspecified: Secondary | ICD-10-CM | POA: Diagnosis not present

## 2021-07-11 DIAGNOSIS — F419 Anxiety disorder, unspecified: Secondary | ICD-10-CM | POA: Diagnosis not present

## 2021-07-11 MED ORDER — ROSUVASTATIN CALCIUM 5 MG PO TABS: 5.0000 mg | ORAL_TABLET | Freq: Every day | ORAL | 1 refills | Status: DC

## 2021-07-11 NOTE — Progress Notes (Signed)
?  ? ? ?Established patient visit ? ?I,April Miller,acting as a scribe for Wilhemena Durie, MD.,have documented all relevant documentation on the behalf of Wilhemena Durie, MD,as directed by  Wilhemena Durie, MD while in the presence of Wilhemena Durie, MD. ? ? ?[name]   DOB: 08/28/1945   76 y.o. Female  MRN: [id number] ?Visit Date: 07/11/2021 ? ?Today's healthcare provider: Wilhemena Durie, MD  ? ?Chief Complaint  ?Patient presents with  ? Follow-up  ? Hyperlipidemia  ? Hypothyroidism  ? ?Subjective  ?  ?HPI  ?Patient comes in today for follow-up.  She feels well overall.  She is trying to cut back on her Ambien. ?It is time to follow-up on her lipids and she is willing to try a low-dose Crestor.  Remembers having myalgias myopathy with previous statin. ?Lipid/Cholesterol, Follow-up ? ?Last lipid panel Other pertinent labs  ?Lab Results  ?Component Value Date  ? CHOL 280 (H) 01/09/2021  ? HDL 71 01/09/2021  ? LDLCALC 178 (H) 01/09/2021  ? TRIG 173 (H) 01/09/2021  ? CHOLHDL 3.9 01/09/2021  ? Lab Results  ?Component Value Date  ? ALT 15 01/09/2021  ? AST 16 01/09/2021  ? PLT 207 01/09/2021  ? TSH 2.090 01/09/2021  ?  ? ?She was last seen for this 6 months ago.  ?Management since that visit includes; labs checked showing-stable. ? ?She reports good compliance with treatment. ?She is not having side effects. none ? ?Current diet: well balanced, low salt ?Current exercise: walking ? ?The 10-year ASCVD risk score (Arnett DK, et al., 2019) is: 16.3% ? ?--------------------------------------------------------------------------------------------------- ?Hypothyroid, follow-up ? ?Lab Results  ?Component Value Date  ? TSH 2.090 01/09/2021  ? TSH 2.480 12/30/2019  ? TSH 3.670 10/20/2018  ? ? ?Wt Readings from Last 3 Encounters:  ?07/11/21 124 lb (56.2 kg)  ?01/09/21 130 lb (59 kg)  ?07/03/20 131 lb (59.4 kg)  ? ? ?She was last seen for hypothyroid 6 months ago.  ?Management since that visit  includes; labs checked showing-stable. ?She reports good compliance with treatment. ?She is not having side effects. none ? ?----------------------------------------------------------------------------------------- ? ? ?Medications: ?Outpatient Medications Prior to Visit  ?Medication Sig  ? acetaminophen (TYLENOL) 500 MG tablet Take 500 mg by mouth every 6 (six) hours as needed for moderate pain or headache.   ? ALPRAZolam (XANAX) 0.5 MG tablet TAKE ONE TABLET BY MOUTH TWICE DAILY AS NEEDED FOR ANXIETY  ? amlodipine-atorvastatin (CADUET) 10-10 MG tablet amlodipine 10 mg-atorvastatin 10 mg tablet ? Take 1 tablet every day by oral route.  ? CALCIUM-VITAMIN D PO Take 1 tablet by mouth daily.   ? levocetirizine (XYZAL) 5 MG tablet Take 1 tablet (5 mg total) by mouth every evening.  ? meloxicam (MOBIC) 15 MG tablet Take 15 mg by mouth daily.  ? omeprazole (PRILOSEC) 20 MG capsule Take 1 capsule (20 mg total) by mouth daily.  ? Polyethyl Glycol-Propyl Glycol 0.4-0.3 % SOLN Place 1-2 drops into both eyes 3 (three) times daily as needed (for dry eyes).  ? zolpidem (AMBIEN) 10 MG tablet TAKE ONE-HALF TABLET BY MOUTH AT BEDTIME.  ? [DISCONTINUED] Nutritional Supplements (OSTEO ADVANCE PO) Take by mouth.  ? ?No facility-administered medications prior to visit.  ? ? ?Review of Systems  ?Constitutional:  Negative for appetite change, chills, fatigue and fever.  ?Respiratory:  Negative for chest tightness and shortness of breath.   ?Cardiovascular:  Negative for chest pain and palpitations.  ?Gastrointestinal:  Negative for abdominal  pain, nausea and vomiting.  ?Neurological:  Negative for dizziness and weakness.  ? ?  ?  Objective  ?  ?BP 134/87 (BP Location: Left Arm, Patient Position: Sitting, Cuff Size: Normal)   Pulse 86   Temp (!) 97 ?F (36.1 ?C) (Temporal)   Resp 16   Ht '5\' 2"'$  (1.575 m)   Wt 124 lb (56.2 kg)   SpO2 97%   BMI 22.68 kg/m?  ?BP Readings from Last 3 Encounters:  ?07/11/21 134/87  ?01/09/21 (!) 144/76   ?07/03/20 139/79  ? ?Wt Readings from Last 3 Encounters:  ?07/11/21 124 lb (56.2 kg)  ?01/09/21 130 lb (59 kg)  ?07/03/20 131 lb (59.4 kg)  ? ?  ? ?Physical Exam ?Vitals reviewed.  ?Constitutional:   ?   Appearance: She is well-developed.  ?HENT:  ?   Head: Normocephalic and atraumatic.  ?   Right Ear: External ear normal.  ?   Left Ear: External ear normal.  ?   Nose: Nose normal.  ?Eyes:  ?   Conjunctiva/sclera: Conjunctivae normal.  ?   Pupils: Pupils are equal, round, and reactive to light.  ?Cardiovascular:  ?   Rate and Rhythm: Normal rate and regular rhythm.  ?   Heart sounds: Normal heart sounds.  ?Pulmonary:  ?   Effort: Pulmonary effort is normal.  ?   Breath sounds: Normal breath sounds.  ?Abdominal:  ?   General: Bowel sounds are normal.  ?   Palpations: Abdomen is soft.  ?Musculoskeletal:     ?   General: Normal range of motion.  ?   Cervical back: Normal range of motion and neck supple.  ?Skin: ?   General: Skin is warm and dry.  ?Neurological:  ?   Mental Status: She is alert and oriented to person, place, and time.  ?Psychiatric:     ?   Behavior: Behavior normal.     ?   Thought Content: Thought content normal.     ?   Judgment: Judgment normal.  ?  ? ? ?No results found for any visits on 07/11/21. ? Assessment & Plan  ?  ? ? ?1. Hypercholesteremia ?Start Crestor 5 mg daily. ?- Lipid panel ?- rosuvastatin (CRESTOR) 5 MG tablet; Take 1 tablet (5 mg total) by mouth daily.  Dispense: 90 tablet; Refill: 1 ? ?2. Elevated TSH ? ? ?3. Coronary artery disease, unspecified vessel or lesion type, unspecified whether angina present, unspecified whether native or transplanted heart ?All risk factors. ?- amlodipine-atorvastatin (CADUET) 10-10 MG tablet; amlodipine 10 mg-atorvastatin 10 mg tablet ? Take 1 tablet every day by oral route. ?- rosuvastatin (CRESTOR) 5 MG tablet; Take 1 tablet (5 mg total) by mouth daily.  Dispense: 90 tablet; Refill: 1 ? ?4. Primary insomnia ?She is encouraged to continue to cut  back on Ambien. ? ?5. Chronic pain of left knee ? ? ?6. Anxiety ?Chronic but improved problem ? ?7. Ductal carcinoma in situ (DCIS) of right breast ?2019 ? ? ? ? ?Return in about 2 months (around 09/10/2021).  ?   ? ?I, Wilhemena Durie, MD, have reviewed all documentation for this visit. The documentation on 07/14/21 for the exam, diagnosis, procedures, and orders are all accurate and complete. ? ? ? ?Ikeisha Blumberg Cranford Mon, MD  ?[facility] ?[phone] (phone) ?[phone] (fax) ? ?[facility] ?

## 2021-07-12 DIAGNOSIS — E7800 Pure hypercholesterolemia, unspecified: Secondary | ICD-10-CM | POA: Diagnosis not present

## 2021-07-13 LAB — LIPID PANEL
Chol/HDL Ratio: 3.7 ratio (ref 0.0–4.4)
Cholesterol, Total: 262 mg/dL — ABNORMAL HIGH (ref 100–199)
HDL: 70 mg/dL (ref >39)
LDL Chol Calc (NIH): 168 mg/dL — ABNORMAL HIGH (ref 0–99)
Triglycerides: 135 mg/dL (ref 0–149)
VLDL Cholesterol Cal: 24 mg/dL (ref 5–40)

## 2021-08-22 DIAGNOSIS — M25562 Pain in left knee: Secondary | ICD-10-CM | POA: Diagnosis not present

## 2021-08-28 ENCOUNTER — Other Ambulatory Visit: Payer: Self-pay | Admitting: Family Medicine

## 2021-08-28 DIAGNOSIS — F5101 Primary insomnia: Secondary | ICD-10-CM

## 2021-08-28 NOTE — Telephone Encounter (Signed)
Requested medication (s) are due for refill today: Due 09/02/21 ? ?Requested medication (s) are on the active medication list: yes   ? ?Last refill: 07/03/21  #30 0 refills ? ?Future visit scheduled yes 09/12/21 ? ?Notes to clinic:NOt delegated, please review. Thank you. ? ?Requested Prescriptions  ?Pending Prescriptions Disp Refills  ? zolpidem (AMBIEN) 10 MG tablet [Pharmacy Med Name: ZOLPIDEM TARTRATE 10 MG TAB] 30 tablet   ?  Sig: TAKE ONE-HALF TABLET BY MOUTH AT BEDTIME.  ?  ? Not Delegated - Psychiatry:  Anxiolytics/Hypnotics Failed - 08/28/2021  9:06 AM  ?  ?  Failed - This refill cannot be delegated  ?  ?  Failed - Urine Drug Screen completed in last 360 days  ?  ?  Passed - Valid encounter within last 6 months  ?  Recent Outpatient Visits   ? ?      ? 1 month ago Hypercholesteremia  ? [facility] Jerrol Banana., MD  ? 7 months ago Encounter for [employer] annual wellness exam  ? [facility] Jerrol Banana., MD  ? 1 year ago Osteoarthritis, unspecified osteoarthritis type, unspecified site  ? [facility] Jerrol Banana., MD  ? 1 year ago Encounter for Medicare annual wellness exam  ? [facility] Jerrol Banana., MD  ? 1 year ago Hypercholesteremia  ? [facility] Jerrol Banana., MD  ? ?  ?  ?Future Appointments   ? ?        ? In 2 weeks Jerrol Banana., MD [facility]  ? ?  ? ? ?  ?  ?  ? ? ? ? ?

## 2021-08-29 ENCOUNTER — Other Ambulatory Visit: Payer: Self-pay | Admitting: Physician Assistant

## 2021-08-29 DIAGNOSIS — F5101 Primary insomnia: Secondary | ICD-10-CM

## 2021-08-30 ENCOUNTER — Other Ambulatory Visit: Payer: Self-pay | Admitting: Physician Assistant

## 2021-08-30 DIAGNOSIS — F5101 Primary insomnia: Secondary | ICD-10-CM

## 2021-08-31 ENCOUNTER — Other Ambulatory Visit: Payer: Self-pay | Admitting: Family Medicine

## 2021-08-31 DIAGNOSIS — F5101 Primary insomnia: Secondary | ICD-10-CM

## 2021-09-03 ENCOUNTER — Telehealth: Payer: Self-pay | Admitting: Family Medicine

## 2021-09-03 NOTE — Telephone Encounter (Signed)
Pt is calling to ask Dr. Rosanna Randy does he need lab work prior 09/12/21 to determine if the rosuvastatin (CRESTOR) 5 MG tablet [[id number]] is effective? ?CB- [phone] ?

## 2021-09-06 NOTE — Telephone Encounter (Signed)
Pt following up on request to have labs done prior to appt next Wed.  If labs are done, we can tell if medication is working. ?

## 2021-09-12 ENCOUNTER — Encounter: Payer: Self-pay | Admitting: Family Medicine

## 2021-09-12 ENCOUNTER — Ambulatory Visit: Payer: Medicare PPO | Admitting: Family Medicine

## 2021-09-12 VITALS — BP 116/77 | HR 73 | Temp 97.8°F | Resp 16 | Ht 62.0 in | Wt 123.7 lb

## 2021-09-12 DIAGNOSIS — D0511 Intraductal carcinoma in situ of right breast: Secondary | ICD-10-CM | POA: Diagnosis not present

## 2021-09-12 DIAGNOSIS — F5101 Primary insomnia: Secondary | ICD-10-CM | POA: Diagnosis not present

## 2021-09-12 DIAGNOSIS — I2510 Atherosclerotic heart disease of native coronary artery without angina pectoris: Secondary | ICD-10-CM | POA: Diagnosis not present

## 2021-09-12 DIAGNOSIS — E7800 Pure hypercholesterolemia, unspecified: Secondary | ICD-10-CM | POA: Diagnosis not present

## 2021-09-12 NOTE — Patient Instructions (Signed)
Try every other day rosuvastatin. ?

## 2021-09-12 NOTE — Progress Notes (Signed)
Established patient visit   [name]   DOB: 1945-06-03   76 y.o. Female  MRN: [id number] Visit Date: 09/12/2021  Today's healthcare provider: Wilhemena Durie, MD   I,Tiffany Darlina Sicilian as a scribe for Wilhemena Durie, MD.,have documented all relevant documentation on the behalf of Wilhemena Durie, MD,as directed by  Wilhemena Durie, MD while in the presence of Wilhemena Durie, MD.   Chief Complaint  Patient presents with   Hyperlipidemia   Subjective    HPI  Patient comes in today for follow-up on rosuvastatin.  She has some mild leg discomfort that she is not sure this is from the statin.  She has some chronic fatigue  Lipid/Cholesterol, Follow-up  Last lipid panel Other pertinent labs  Lab Results  Component Value Date   CHOL 262 (H) 07/12/2021   HDL 70 07/12/2021   LDLCALC 168 (H) 07/12/2021   TRIG 135 07/12/2021   CHOLHDL 3.7 07/12/2021   Lab Results  Component Value Date   ALT 15 01/09/2021   AST 16 01/09/2021   PLT 207 01/09/2021   TSH 2.090 01/09/2021     She was last seen for this 2 months ago.  Management since that visit includes; Started Crestor 5 mg daily.  The 10-year ASCVD risk score (Arnett DK, et al., 2019) is: 12.4%  ---------------------------------------------------------------------------------------------------   Medications: Outpatient Medications Prior to Visit  Medication Sig   acetaminophen (TYLENOL) 500 MG tablet Take 500 mg by mouth every 6 (six) hours as needed for moderate pain or headache.    ALPRAZolam (XANAX) 0.5 MG tablet TAKE ONE TABLET BY MOUTH TWICE DAILY AS NEEDED FOR ANXIETY   amlodipine-atorvastatin (CADUET) 10-10 MG tablet amlodipine 10 mg-atorvastatin 10 mg tablet  Take 1 tablet every day by oral route.   CALCIUM-VITAMIN D PO Take 1 tablet by mouth daily.    levocetirizine (XYZAL) 5 MG tablet Take 1 tablet (5 mg total) by mouth every evening.   meloxicam (MOBIC) 15 MG tablet Take 15  mg by mouth daily.   omeprazole (PRILOSEC) 20 MG capsule Take 1 capsule (20 mg total) by mouth daily.   Polyethyl Glycol-Propyl Glycol 0.4-0.3 % SOLN Place 1-2 drops into both eyes 3 (three) times daily as needed (for dry eyes).   rosuvastatin (CRESTOR) 5 MG tablet Take 1 tablet (5 mg total) by mouth daily.   zolpidem (AMBIEN) 10 MG tablet TAKE ONE-HALF TABLET BY MOUTH AT BEDTIME.   No facility-administered medications prior to visit.    Review of Systems  Constitutional:  Negative for appetite change, chills, fatigue and fever.  Respiratory:  Negative for chest tightness and shortness of breath.   Cardiovascular:  Negative for chest pain and palpitations.  Gastrointestinal:  Negative for abdominal pain, nausea and vomiting.  Neurological:  Negative for dizziness and weakness.   Last lipids Lab Results  Component Value Date   CHOL 178 09/12/2021   HDL 73 09/12/2021   LDLCALC 85 09/12/2021   TRIG 114 09/12/2021   CHOLHDL 2.4 09/12/2021       Objective    BP 116/77 (BP Location: Left Arm, Patient Position: Sitting, Cuff Size: Normal)   Pulse 73   Temp 97.8 F (36.6 C) (Oral)   Resp 16   Ht '5\' 2"'$  (1.575 m)   Wt 123 lb 11.2 oz (56.1 kg)   SpO2 98%   BMI 22.63 kg/m  BP Readings from Last 3 Encounters:  09/12/21 116/77  07/11/21 134/87  01/09/21 (!) 144/76   Wt Readings from Last 3 Encounters:  09/12/21 123 lb 11.2 oz (56.1 kg)  07/11/21 124 lb (56.2 kg)  01/09/21 130 lb (59 kg)      Physical Exam Vitals reviewed.  Constitutional:      Appearance: She is well-developed.  HENT:     Head: Normocephalic and atraumatic.     Right Ear: External ear normal.     Left Ear: External ear normal.     Nose: Nose normal.  Eyes:     Conjunctiva/sclera: Conjunctivae normal.     Pupils: Pupils are equal, round, and reactive to light.  Cardiovascular:     Rate and Rhythm: Normal rate and regular rhythm.     Heart sounds: Normal heart sounds.  Pulmonary:     Effort:  Pulmonary effort is normal.     Breath sounds: Normal breath sounds.  Abdominal:     General: Bowel sounds are normal.     Palpations: Abdomen is soft.  Musculoskeletal:        General: Normal range of motion.     Cervical back: Normal range of motion and neck supple.  Skin:    General: Skin is warm and dry.  Neurological:     Mental Status: She is alert and oriented to person, place, and time.  Psychiatric:        Behavior: Behavior normal.        Thought Content: Thought content normal.        Judgment: Judgment normal.      No results found for any visits on 09/12/21.  Assessment & Plan     1. Hypercholesteremia Lipids.  Splane that patient will get some protection just by being on the statin for plaque stabilization. - Lipid panel  2. Coronary artery disease, unspecified vessel or lesion type, unspecified whether angina present, unspecified whether native or transplanted heart All risk factors treated  3. Primary insomnia Patient knows to try to come off of the zolpidem  4. Ductal carcinoma in situ (DCIS) of right breast Follow-up with yearly mammogram   No follow-ups on file.      I, Wilhemena Durie, MD, have reviewed all documentation for this visit. The documentation on 09/15/21 for the exam, diagnosis, procedures, and orders are all accurate and complete.    Arline Ketter Cranford Mon, MD  [facility] [phone] (phone) [phone] (fax)  [place]

## 2021-09-13 LAB — LIPID PANEL
Chol/HDL Ratio: 2.4 ratio (ref 0.0–4.4)
Cholesterol, Total: 178 mg/dL (ref 100–199)
HDL: 73 mg/dL (ref >39)
LDL Chol Calc (NIH): 85 mg/dL (ref 0–99)
Triglycerides: 114 mg/dL (ref 0–149)
VLDL Cholesterol Cal: 20 mg/dL (ref 5–40)

## 2021-09-14 DIAGNOSIS — M170 Bilateral primary osteoarthritis of knee: Secondary | ICD-10-CM | POA: Diagnosis not present

## 2021-09-27 DIAGNOSIS — R9431 Abnormal electrocardiogram [ECG] [EKG]: Secondary | ICD-10-CM | POA: Diagnosis not present

## 2021-09-27 DIAGNOSIS — E7800 Pure hypercholesterolemia, unspecified: Secondary | ICD-10-CM | POA: Diagnosis not present

## 2021-09-27 DIAGNOSIS — I10 Essential (primary) hypertension: Secondary | ICD-10-CM | POA: Diagnosis not present

## 2021-10-08 ENCOUNTER — Other Ambulatory Visit: Payer: Self-pay | Admitting: Family Medicine

## 2021-10-11 DIAGNOSIS — M1712 Unilateral primary osteoarthritis, left knee: Secondary | ICD-10-CM | POA: Diagnosis not present

## 2021-10-16 DIAGNOSIS — M1712 Unilateral primary osteoarthritis, left knee: Secondary | ICD-10-CM | POA: Diagnosis not present

## 2021-10-24 DIAGNOSIS — M1712 Unilateral primary osteoarthritis, left knee: Secondary | ICD-10-CM | POA: Diagnosis not present

## 2021-10-28 ENCOUNTER — Other Ambulatory Visit: Payer: Self-pay | Admitting: Family Medicine

## 2021-10-28 DIAGNOSIS — E7800 Pure hypercholesterolemia, unspecified: Secondary | ICD-10-CM

## 2021-10-28 DIAGNOSIS — I2510 Atherosclerotic heart disease of native coronary artery without angina pectoris: Secondary | ICD-10-CM

## 2021-11-02 ENCOUNTER — Other Ambulatory Visit: Payer: Self-pay | Admitting: Family Medicine

## 2021-11-02 DIAGNOSIS — Z1231 Encounter for screening mammogram for malignant neoplasm of breast: Secondary | ICD-10-CM

## 2021-12-05 DIAGNOSIS — M1712 Unilateral primary osteoarthritis, left knee: Secondary | ICD-10-CM | POA: Diagnosis not present

## 2021-12-27 ENCOUNTER — Other Ambulatory Visit: Payer: Self-pay | Admitting: Family Medicine

## 2021-12-27 DIAGNOSIS — F5101 Primary insomnia: Secondary | ICD-10-CM

## 2022-01-11 DIAGNOSIS — L42 Pityriasis rosea: Secondary | ICD-10-CM | POA: Diagnosis not present

## 2022-01-18 ENCOUNTER — Ambulatory Visit
Admission: RE | Admit: 2022-01-18 | Discharge: 2022-01-18 | Disposition: A | Discharge status: Home / Self Care | Payer: Medicare PPO | Source: Ambulatory Visit | Attending: Family Medicine | Admitting: Family Medicine

## 2022-01-18 DIAGNOSIS — Z1231 Encounter for screening mammogram for malignant neoplasm of breast: Secondary | ICD-10-CM | POA: Diagnosis not present

## 2022-01-29 ENCOUNTER — Ambulatory Visit: Payer: Medicare PPO | Admitting: Physician Assistant

## 2022-02-22 ENCOUNTER — Ambulatory Visit: Payer: Self-pay

## 2022-02-25 ENCOUNTER — Ambulatory Visit: Payer: Self-pay

## 2022-03-13 ENCOUNTER — Ambulatory Visit: Payer: Medicare PPO | Admitting: Family Medicine

## 2022-04-10 ENCOUNTER — Ambulatory Visit: Payer: Medicare PPO | Admitting: Family Medicine

## 2022-06-10 ENCOUNTER — Encounter: Payer: Self-pay | Admitting: *Deleted

## 2022-06-11 ENCOUNTER — Encounter
Admission: RE | Disposition: A | Discharge status: Home / Self Care | Payer: Self-pay | Source: Home / Self Care | Attending: Gastroenterology

## 2022-06-11 ENCOUNTER — Ambulatory Visit
Admission: RE | Admit: 2022-06-11 | Discharge: 2022-06-11 | Disposition: A | Discharge status: Home / Self Care | Payer: Medicare PPO | Attending: Gastroenterology | Admitting: Gastroenterology

## 2022-06-11 ENCOUNTER — Encounter: Payer: Self-pay | Admitting: *Deleted

## 2022-06-11 ENCOUNTER — Ambulatory Visit: Payer: Medicare PPO | Admitting: Anesthesiology

## 2022-06-11 DIAGNOSIS — K219 Gastro-esophageal reflux disease without esophagitis: Secondary | ICD-10-CM | POA: Diagnosis not present

## 2022-06-11 DIAGNOSIS — K640 First degree hemorrhoids: Secondary | ICD-10-CM | POA: Insufficient documentation

## 2022-06-11 DIAGNOSIS — I10 Essential (primary) hypertension: Secondary | ICD-10-CM | POA: Diagnosis not present

## 2022-06-11 DIAGNOSIS — Z86010 Personal history of colonic polyps: Secondary | ICD-10-CM | POA: Insufficient documentation

## 2022-06-11 DIAGNOSIS — Z853 Personal history of malignant neoplasm of breast: Secondary | ICD-10-CM | POA: Diagnosis not present

## 2022-06-11 DIAGNOSIS — Z1211 Encounter for screening for malignant neoplasm of colon: Secondary | ICD-10-CM | POA: Diagnosis present

## 2022-06-11 HISTORY — PX: COLONOSCOPY WITH PROPOFOL: SHX5780

## 2022-06-11 HISTORY — DX: Polymyalgia rheumatica: M35.3

## 2022-06-11 HISTORY — DX: Age-related osteoporosis without current pathological fracture: M81.0

## 2022-06-11 SURGERY — COLONOSCOPY WITH PROPOFOL
Anesthesia: General

## 2022-06-11 MED ORDER — PROPOFOL 10 MG/ML IV BOLUS
INTRAVENOUS | Status: DC | PRN
  Administered 2022-06-11: 20 mg via INTRAVENOUS
  Administered 2022-06-11: 60 mg via INTRAVENOUS

## 2022-06-11 MED ORDER — PROPOFOL 500 MG/50ML IV EMUL
INTRAVENOUS | Status: DC | PRN
  Administered 2022-06-11: 175 ug/kg/min via INTRAVENOUS

## 2022-06-11 MED ORDER — SODIUM CHLORIDE 0.9 % IV SOLN: INTRAVENOUS | Status: DC

## 2022-06-11 MED ORDER — LIDOCAINE HCL (CARDIAC) PF 100 MG/5ML IV SOSY
PREFILLED_SYRINGE | INTRAVENOUS | Status: DC | PRN
  Administered 2022-06-11: 100 mg via INTRAVENOUS

## 2022-06-11 NOTE — Anesthesia Postprocedure Evaluation (Signed)
Anesthesia Post Note  [name]  Procedure(s) Performed: COLONOSCOPY WITH PROPOFOL  Patient location during evaluation: PACU Anesthesia Type: General Level of consciousness: awake and alert, oriented and patient cooperative Pain management: pain level controlled Vital Signs Assessment: post-procedure vital signs reviewed and stable Respiratory status: spontaneous breathing, nonlabored ventilation and respiratory function stable Cardiovascular status: blood pressure returned to baseline and stable Postop Assessment: adequate PO intake Anesthetic complications: no   No notable events documented.   Last Vitals:  Vitals:   06/11/22 1014 06/11/22 1024  BP: (!) 141/65 (!) 134/55  Pulse: 60 76  Resp: 16 11  Temp:    SpO2: 100% (!) 88%    Last Pain:  Vitals:   06/11/22 0812  TempSrc: Temporal                 Darrin Nipper

## 2022-06-11 NOTE — Transfer of Care (Signed)
Immediate Anesthesia Transfer of Care Note  [name]  Procedure(s) Performed: COLONOSCOPY WITH PROPOFOL  Patient Location: Endoscopy Unit  Anesthesia Type:General  Level of Consciousness: drowsy and patient cooperative  Airway & Oxygen Therapy: Patient Spontanous Breathing and Patient connected to face mask oxygen  Post-op Assessment: Report given to RN and Post -op Vital signs reviewed and stable  Post vital signs: Reviewed and stable  Last Vitals:  Vitals Value Taken Time  BP 100/50 06/11/22 0954  Temp    Pulse 55 06/11/22 0954  Resp 14 06/11/22 0954  SpO2 100 % 06/11/22 0954  Vitals shown include unvalidated device data.  Last Pain:  Vitals:   06/11/22 0812  TempSrc: Temporal         Complications: No notable events documented.

## 2022-06-11 NOTE — Anesthesia Preprocedure Evaluation (Addendum)
Anesthesia Evaluation  Patient identified by MRN, date of birth, ID band Patient awake    Reviewed: Allergy & Precautions, NPO status , Patient's Chart, lab work & pertinent test results  History of Anesthesia Complications Negative for: history of anesthetic complications  Airway Mallampati: I   Neck ROM: Full    Dental  (+) Missing Crowns :   Pulmonary neg pulmonary ROS   Pulmonary exam normal breath sounds clear to auscultation       Cardiovascular hypertension, Normal cardiovascular exam Rhythm:Regular Rate:Normal  Echo 02/23/21:  Normal Stress Echocardiogram  NORMAL RIGHT VENTRICULAR SYSTOLIC FUNCTION  TRIVIAL REGURGITATION NOTED  NO VALVULAR STENOSIS NOTED    Neuro/Psych  Headaches PSYCHIATRIC DISORDERS Anxiety Depression       GI/Hepatic ,GERD  ,,  Endo/Other  negative endocrine ROS    Renal/GU negative Renal ROS     Musculoskeletal  (+) Arthritis ,  Raynauds; polymyalgia rheumatica   Abdominal   Peds  Hematology Breast CA   Anesthesia Other Findings Cardiology note 04/02/22:  77 y.o. female with  1. Essential hypertension  2. Heart palpitations  3. SOB (shortness of breath) on exertion   77 year old female iniitally referred for exertional dyspnea and palpitations. She denies shortness of breath at this time and takes a 1.5 to 2 mile walk daily. She reports infrequent palpitations. Patient has essential hypertension, blood pressure adequately controlled on amlodipine. Stress echocardiogram 05/26/2020 was normal. 72-hr Holter monitor showed sinus rhythm with rare PVCs, PACs and 3 atrial runs.   Plan   1. Continue current medications 2. Continue low-sodium diet 3. Recommend Mediterranean Diet 4. Continue regular physical activity 5. Return to clinic for follow-up in 1 year  No orders of the defined types were placed in this encounter.  Return in about 1 year (around 04/03/2023).     Reproductive/Obstetrics                             Anesthesia Physical Anesthesia Plan  ASA: 2  Anesthesia Plan: General   Post-op Pain Management:    Induction: Intravenous  PONV Risk Score and Plan: 3 and Propofol infusion, TIVA and Treatment may vary due to age or medical condition  Airway Management Planned: Natural Airway  Additional Equipment:   Intra-op Plan:   Post-operative Plan:   Informed Consent: I have reviewed the patients History and Physical, chart, labs and discussed the procedure including the risks, benefits and alternatives for the proposed anesthesia with the patient or authorized representative who has indicated his/her understanding and acceptance.       Plan Discussed with: CRNA  Anesthesia Plan Comments: (LMA/GETA backup discussed.  Patient consented for risks of anesthesia including but not limited to:  - adverse reactions to medications - damage to eyes, teeth, lips or other oral mucosa - nerve damage due to positioning  - sore throat or hoarseness - damage to heart, brain, nerves, lungs, other parts of body or loss of life  Informed patient about role of CRNA in peri- and intra-operative care.  Patient voiced understanding.)        Anesthesia Quick Evaluation

## 2022-06-11 NOTE — Op Note (Signed)
[facility] Patient Name: Sherri Cisneros Procedure Date: 06/11/2022 9:23 AM MRN: [id number] Account #: [id number] Date of Birth: 06-27-1945 Admit Type: Outpatient Age: 77 Room: [facility] ENDO ROOM 1 Gender: Female Note Status: Finalized Instrument Name: Park Meo [id number] Procedure:             Colonoscopy Indications:           Surveillance: Personal history of adenomatous polyps                         on last colonoscopy > 5 years ago Providers:             Andrey Farmer MD, MD Referring MD:          Ramonita Lab, MD (Referring MD) Medicines:             Monitored Anesthesia Care Complications:         No immediate complications. Procedure:             Pre-Anesthesia Assessment:                        - Prior to the procedure, a History and Physical was                         performed, and patient medications and allergies were                         reviewed. The patient is competent. The risks and                         benefits of the procedure and the sedation options and                         risks were discussed with the patient. All questions                         were answered and informed consent was obtained.                         Patient identification and proposed procedure were                         verified by the physician, the nurse, the                         anesthesiologist, the anesthetist and the technician                         in the endoscopy suite. Mental Status Examination:                         alert and oriented. Airway Examination: normal                         oropharyngeal airway and neck mobility. Respiratory                         Examination: clear to auscultation. CV Examination:  normal. Prophylactic Antibiotics: The patient does not                         require prophylactic antibiotics. Prior                         Anticoagulants: The patient has taken no anticoagulant                          or antiplatelet agents. ASA Grade Assessment: II - A                         patient with mild systemic disease. After reviewing                         the risks and benefits, the patient was deemed in                         satisfactory condition to undergo the procedure. The                         anesthesia plan was to use monitored anesthesia care                         (MAC). Immediately prior to administration of                         medications, the patient was re-assessed for adequacy                         to receive sedatives. The heart rate, respiratory                         rate, oxygen saturations, blood pressure, adequacy of                         pulmonary ventilation, and response to care were                         monitored throughout the procedure. The physical                         status of the patient was re-assessed after the                         procedure.                        After obtaining informed consent, the colonoscope was                         passed under direct vision. Throughout the procedure,                         the patient's blood pressure, pulse, and oxygen                         saturations were monitored continuously. The  Colonoscope was introduced through the anus and                         advanced to the the cecum, identified by appendiceal                         orifice and ileocecal valve. The colonoscopy was                         somewhat difficult due to a redundant colon. The                         patient tolerated the procedure well. The quality of                         the bowel preparation was good. The ileocecal valve,                         appendiceal orifice, and rectum were photographed. Findings:      The perianal and digital rectal examinations were normal.      Internal hemorrhoids were found during retroflexion. The hemorrhoids       were Grade I  (internal hemorrhoids that do not prolapse).      The exam was otherwise without abnormality on direct and retroflexion       views. Impression:            - Internal hemorrhoids.                        - The examination was otherwise normal on direct and                         retroflexion views.                        - No specimens collected. Recommendation:        - Discharge patient to home.                        - Resume previous diet.                        - Continue present medications.                        - Repeat colonoscopy in 5 years for surveillance if                         benefits outweigh risks. Mainly due to redundant colon.                        - Return to referring physician as previously                         scheduled. Procedure Code(s):     --- Professional ---                        G2542, Colorectal cancer screening; colonoscopy on  individual at high risk Diagnosis Code(s):     --- Professional ---                        Z86.010, Personal history of colonic polyps                        K64.0, First degree hemorrhoids CPT copyright 2022 American [employer] Association. All rights reserved. The codes documented in this report are preliminary and upon coder review may  be revised to meet current compliance requirements. Andrey Farmer MD, MD 06/11/2022 9:54:00 AM Number of Addenda: 0 Note Initiated On: 06/11/2022 9:23 AM Scope Withdrawal Time: 0 hours 9 minutes 20 seconds  Total Procedure Duration: 0 hours 18 minutes 49 seconds  Estimated Blood Loss:  Estimated blood loss: none.      [facility]

## 2022-06-11 NOTE — H&P (Signed)
Outpatient short stay form Pre-procedure 06/11/2022  Sherri Rubenstein, MD  Primary Physician: Adin Hector, MD  Reason for visit:  Surveillance  History of present illness:    77 y/o lady with history of hypertension and PMR here for surveillance colonoscopy. Last colonoscopy in 2018 with small SSA. No blood thinners. No significant abdominal surgeries. No family history of GI malignancies.    Current Facility-Administered Medications:    0.9 %  sodium chloride infusion, , Intravenous, Continuous, Amahd Morino, Hilton Cork, MD, Last Rate: 20 mL/hr at 06/11/22 0857, Continued from Pre-op at 06/11/22 0857  Medications Prior to Admission  Medication Sig Dispense Refill Last Dose   amlodipine-atorvastatin (CADUET) 10-10 MG tablet amlodipine 10 mg-atorvastatin 10 mg tablet  Take 1 tablet every day by oral route.   06/11/2022   predniSONE (DELTASONE) 5 MG tablet Take 5 mg by mouth daily with breakfast.   06/10/2022   rosuvastatin (CRESTOR) 5 MG tablet TAKE ONE TABLET (5 MG) BY MOUTH EVERY DAY 90 tablet 3 Past Week   Zoledronic Acid (RECLAST IV) Inject into the vein.   05/08/2022   zolpidem (AMBIEN) 10 MG tablet TAKE ONE-HALF TABLET BY MOUTH AT BEDTIME. 30 tablet 1 06/10/2022   acetaminophen (TYLENOL) 500 MG tablet Take 500 mg by mouth every 6 (six) hours as needed for moderate pain or headache.       ALPRAZolam (XANAX) 0.5 MG tablet TAKE ONE TABLET BY MOUTH TWICE DAILY AS NEEDED FOR ANXIETY (Patient not taking: Reported on 06/11/2022) 60 tablet 0 Not Taking   CALCIUM-VITAMIN D PO Take 1 tablet by mouth daily.       levocetirizine (XYZAL) 5 MG tablet Take 1 tablet (5 mg total) by mouth every evening. 90 tablet 3    meloxicam (MOBIC) 15 MG tablet Take 15 mg by mouth daily.      omeprazole (PRILOSEC) 20 MG capsule TAKE 1 CAPSULE BY MOUTH EVERY DAY 180 capsule 1    Polyethyl Glycol-Propyl Glycol 0.4-0.3 % SOLN Place 1-2 drops into both eyes 3 (three) times daily as needed (for dry eyes).         Allergies  Allergen Reactions   Actonel [Risedronate Sodium] Other (See Comments)    GI side effects   Alendronate Other (See Comments)    GI side effects   Clarithromycin Other (See Comments)    Unknown   Erythromycin Other (See Comments)    Jaundice;  CAN TAKE MACROBID PER PATIENT   Other    Sulfa Antibiotics Other (See Comments)    Unknown   Bactrim [Sulfamethoxazole-Trimethoprim] Rash   Grapeseed Extract [Nutritional Supplements] Rash    Some raw fruits cause mouth to "break out"     Past Medical History:  Diagnosis Date   Anxiety    Arthritis    Breast cancer ([place])    Cancer ([facility])    DCIS, high-grade.  ER/PR negative   Colon polyp 2018   GERD (gastroesophageal reflux disease)    Hematuria    Osteoporosis    Personal history of radiation therapy    Plantar fasciitis 2016, 2019   right foot   Polymyalgia ([place])    Raynaud's syndrome    Sinus headache    Vertigo    no episodes in several years    Review of systems:  Otherwise negative.    Physical Exam  Gen: Alert, oriented. Appears stated age.  HEENT: PERRLA. Lungs: No respiratory distress CV: RRR Abd: soft, benign, no masses Ext: No edema  Planned procedures: Proceed with colonoscopy. The patient understands the nature of the planned procedure, indications, risks, alternatives and potential complications including but not limited to bleeding, infection, perforation, damage to internal organs and possible oversedation/side effects from anesthesia. The patient agrees and gives consent to proceed.  Please refer to procedure notes for findings, recommendations and patient disposition/instructions.     Sherri Rubenstein, MD [facility]

## 2022-06-11 NOTE — Interval H&P Note (Signed)
History and Physical Interval Note:  06/11/2022 9:24 AM  Sherri Cisneros  has presented today for surgery, with the diagnosis of h/o adenomatous polyps.  The various methods of treatment have been discussed with the patient and family. After consideration of risks, benefits and other options for treatment, the patient has consented to  Procedure(s): COLONOSCOPY WITH PROPOFOL (N/A) as a surgical intervention.  The patient's history has been reviewed, patient examined, no change in status, stable for surgery.  I have reviewed the patient's chart and labs.  Questions were answered to the patient's satisfaction.     Lesly Rubenstein  Ok to proceed with colonoscopy

## 2022-06-12 ENCOUNTER — Encounter: Payer: Self-pay | Admitting: Gastroenterology

## 2022-06-24 ENCOUNTER — Other Ambulatory Visit: Payer: Self-pay

## 2022-06-24 DIAGNOSIS — R1032 Left lower quadrant pain: Secondary | ICD-10-CM

## 2022-07-02 ENCOUNTER — Ambulatory Visit
Admission: RE | Admit: 2022-07-02 | Discharge: 2022-07-02 | Disposition: A | Discharge status: Home / Self Care | Payer: Medicare PPO | Source: Ambulatory Visit | Attending: Internal Medicine | Admitting: Internal Medicine

## 2022-07-02 DIAGNOSIS — R1032 Left lower quadrant pain: Secondary | ICD-10-CM

## 2022-12-13 ENCOUNTER — Other Ambulatory Visit: Payer: Self-pay | Admitting: Internal Medicine

## 2022-12-13 DIAGNOSIS — Z1231 Encounter for screening mammogram for malignant neoplasm of breast: Secondary | ICD-10-CM

## 2023-01-20 ENCOUNTER — Ambulatory Visit
Admission: RE | Admit: 2023-01-20 | Discharge: 2023-01-20 | Disposition: A | Discharge status: Home / Self Care | Payer: Medicare PPO | Source: Ambulatory Visit | Attending: Internal Medicine | Admitting: Internal Medicine

## 2023-01-20 DIAGNOSIS — Z1231 Encounter for screening mammogram for malignant neoplasm of breast: Secondary | ICD-10-CM | POA: Diagnosis present

## 2023-06-30 ENCOUNTER — Ambulatory Visit: Payer: Self-pay | Admitting: Surgery

## 2023-07-02 ENCOUNTER — Inpatient Hospital Stay: Attending: Oncology | Admitting: Oncology

## 2023-07-02 ENCOUNTER — Inpatient Hospital Stay

## 2023-07-02 ENCOUNTER — Encounter: Payer: Self-pay | Admitting: Oncology

## 2023-07-02 ENCOUNTER — Ambulatory Visit
Admission: RE | Admit: 2023-07-02 | Discharge: 2023-07-02 | Disposition: A | Discharge status: Home / Self Care | Source: Ambulatory Visit | Attending: Oncology | Admitting: Oncology

## 2023-07-02 VITALS — BP 135/79 | HR 85 | Temp 96.5°F | Resp 20 | Wt 122.8 lb

## 2023-07-02 DIAGNOSIS — D649 Anemia, unspecified: Secondary | ICD-10-CM

## 2023-07-02 DIAGNOSIS — Z79899 Other long term (current) drug therapy: Secondary | ICD-10-CM | POA: Diagnosis not present

## 2023-07-02 DIAGNOSIS — Z86000 Personal history of in-situ neoplasm of breast: Secondary | ICD-10-CM | POA: Diagnosis not present

## 2023-07-02 DIAGNOSIS — Z923 Personal history of irradiation: Secondary | ICD-10-CM | POA: Diagnosis not present

## 2023-07-02 DIAGNOSIS — D472 Monoclonal gammopathy: Secondary | ICD-10-CM

## 2023-07-02 DIAGNOSIS — Z803 Family history of malignant neoplasm of breast: Secondary | ICD-10-CM | POA: Diagnosis not present

## 2023-07-02 LAB — CBC WITH DIFFERENTIAL/PLATELET
Abs Immature Granulocytes: 0.01 10*3/uL (ref 0.00–0.07)
Basophils Absolute: 0 10*3/uL (ref 0.0–0.1)
Basophils Relative: 1 %
Eosinophils Absolute: 0 10*3/uL (ref 0.0–0.5)
Eosinophils Relative: 1 %
HCT: 36.8 % (ref 36.0–46.0)
Hemoglobin: 11.8 g/dL — ABNORMAL LOW (ref 12.0–15.0)
Immature Granulocytes: 0 %
Lymphocytes Relative: 34 %
Lymphs Abs: 1.3 10*3/uL (ref 0.7–4.0)
MCH: 30.6 pg (ref 26.0–34.0)
MCHC: 32.1 g/dL (ref 30.0–36.0)
MCV: 95.3 fL (ref 80.0–100.0)
Monocytes Absolute: 0.3 10*3/uL (ref 0.1–1.0)
Monocytes Relative: 8 %
Neutro Abs: 2.1 10*3/uL (ref 1.7–7.7)
Neutrophils Relative %: 56 %
Platelets: 225 10*3/uL (ref 150–400)
RBC: 3.86 MIL/uL — ABNORMAL LOW (ref 3.87–5.11)
RDW: 17.9 % — ABNORMAL HIGH (ref 11.5–15.5)
WBC: 3.8 10*3/uL — ABNORMAL LOW (ref 4.0–10.5)
nRBC: 0 % (ref 0.0–0.2)

## 2023-07-02 LAB — COMPREHENSIVE METABOLIC PANEL
ALT: 30 U/L (ref 0–44)
AST: 28 U/L (ref 15–41)
Albumin: 3.7 g/dL (ref 3.5–5.0)
Alkaline Phosphatase: 28 U/L — ABNORMAL LOW (ref 38–126)
Anion gap: 6 (ref 5–15)
BUN: 19 mg/dL (ref 8–23)
CO2: 24 mmol/L (ref 22–32)
Calcium: 9.7 mg/dL (ref 8.9–10.3)
Chloride: 104 mmol/L (ref 98–111)
Creatinine, Ser: 0.86 mg/dL (ref 0.44–1.00)
GFR, Estimated: >60 mL/min (ref >60)
Glucose, Bld: 119 mg/dL — ABNORMAL HIGH (ref 70–99)
Potassium: 3.9 mmol/L (ref 3.5–5.1)
Sodium: 134 mmol/L — ABNORMAL LOW (ref 135–145)
Total Bilirubin: 0.7 mg/dL (ref 0.0–1.2)
Total Protein: 7.5 g/dL (ref 6.5–8.1)

## 2023-07-02 LAB — LACTATE DEHYDROGENASE: LDH: 183 U/L (ref 98–192)

## 2023-07-02 NOTE — Assessment & Plan Note (Signed)
 Mild anemia, this could be due to chronic methotrexate use. Pending above work up.

## 2023-07-02 NOTE — Progress Notes (Signed)
 Hematology/Oncology Consult Note [phone] [phone]    Patient Care Team: Lynnea Ferrier, MD as PCP - General (Internal Medicine) Eli Phillips, OD as Consulting Physician (Optometry) Nibbe, Annye Rusk, DPM as Consulting Physician (Podiatry) Jesusita Oka, MD as Consulting Physician (Dermatology) Rickard Patience, MD as Consulting Physician (Oncology)  REFERRING PROVIDER: Lynnea Ferrier, MD  CHIEF COMPLAINTS/REASON FOR VISIT:  Evaluation of abnormal SPEP  ASSESSMENT & PLAN:   Monoclonal gammopathy Labs are reviewed and discussed with patient. Concerning for plasma cell disorders.  Recommend checking cbc cmp myeloma panel, light chain ratio, beta2 microglobulin, LDH, 24 h UPEP Recommend bone marrow biopsy and skeletal survey  Normocytic anemia Mild anemia, this could be due to chronic methotrexate use. Pending above work up.   History of ductal carcinoma in situ (DCIS) of right breast ER/PR negative. S/p lumpectomy and radiation.  Continue annual mammogram, ordered by her PCP   Orders Placed This Encounter  Procedures   DG Bone Survey Met    Standing Status:   Future    Expected Date:   07/02/2023    Expiration Date:   07/01/2024    Reason for Exam (SYMPTOM  OR DIAGNOSIS REQUIRED):   monoclonal gammpathy    Preferred imaging location?:   [facility]   CBC with Differential/Platelet    Standing Status:   Future    Number of Occurrences:   1    Expected Date:   07/02/2023    Expiration Date:   07/01/2024   Kappa/lambda light chains    Standing Status:   Future    Number of Occurrences:   1    Expected Date:   07/02/2023    Expiration Date:   07/01/2024   Beta 2 microglobulin, serum    Standing Status:   Future    Number of Occurrences:   1    Expected Date:   07/02/2023    Expiration Date:   07/01/2024   Multiple Myeloma Panel (SPEP&IFE w/QIG)    Standing Status:   Future    Number of Occurrences:   1    Expected Date:   07/02/2023     Expiration Date:   07/01/2024   Lactate dehydrogenase    Standing Status:   Future    Number of Occurrences:   1    Expected Date:   07/02/2023    Expiration Date:   07/01/2024   Comprehensive metabolic panel    Standing Status:   Future    Number of Occurrences:   1    Expected Date:   07/02/2023    Expiration Date:   07/01/2024   IFE+PROTEIN ELECTRO, 24-HR UR    Standing Status:   Future    Expected Date:   07/02/2023    Expiration Date:   07/01/2024   Follow up TBD All questions were answered. The patient knows to call the clinic with any problems, questions or concerns.  Rickard Patience, MD, PhD [facility] Hematology Oncology 07/02/2023    HISTORY OF PRESENTING ILLNESS:  Sherri Cisneros is a 78 y.o. female who was seen in consultation at the request of Lynnea Ferrier, MD for evaluation of abnormal SPEP results.   Patient recently had work up done at [employer] [facility] [employer] office. Lab results were reviewed,  06/24/2023 SPEP showed M spike 1.3, Random UPEP showed M protein of 4.5  lamda free light chain was elevated at 125.8, kappa free light chain 6.6.  Ratio is decreased at 0.05.  She denies history of abnormal bone pain or bone fracture. She has chronic lower back pain.  Denies chills, night sweats, anorexia or abnormal weight loss.  She has a history of polymyalgia rheumatica and has been on methotrexate and prednisone. Despite this treatment, her sed rate has not decreased as expected, prompting further investigation by her rheumatologist. She has been experiencing headaches and is scheduled for a temporal artery biopsy on 07/31/2023  to check for giant cell arteritis.  She has a history of right breast ductal carcinoma in situ treated with lumpectomy and radiation in October 2019. She did not take any hormonal receptor blockers post-treatment, and stated that follow-up mammograms and breast exams have been normal.    MEDICAL HISTORY:  Past Medical History:  Diagnosis Date   Anxiety    Arthritis     Breast cancer (HCC)    Cancer (HCC)    DCIS, high-grade.  ER/PR negative   Colon polyp 2018   GERD (gastroesophageal reflux disease)    Hematuria    Osteoporosis    Personal history of radiation therapy    Plantar fasciitis 2016, 2019   right foot   Polymyalgia (HCC)    Raynaud's syndrome    Sinus headache    Vertigo    no episodes in several years    SURGICAL HISTORY: Past Surgical History:  Procedure Laterality Date   BREAST BIOPSY Right 01/21/2018   affirm bx ribbon clip DUCTAL CARCINOMA IN SITU, HIGH-GRADE COMEDO TYPE     BREAST EXCISIONAL BIOPSY Right 1998   negative, Dr Lemar Livings   BREAST LUMPECTOMY Right 02/06/2018   Procedure: BREAST WIDE EXCISION;  Surgeon: Earline Mayotte, MD;  Location: [facility] ORS;  Service: General;  Laterality: Right;   CATARACT EXTRACTION W/PHACO Left 11/10/2018   Procedure: CATARACT EXTRACTION PHACO AND INTRAOCULAR LENS PLACEMENT (IOC)  LEFT;  Surgeon: Galen Manila, MD;  Location: [facility];  Service: Ophthalmology;  Laterality: Left;   CATARACT EXTRACTION W/PHACO Right 12/01/2018   Procedure: CATARACT EXTRACTION PHACO AND INTRAOCULAR LENS PLACEMENT (IOC)  RIGHT;  Surgeon: Galen Manila, MD;  Location: [facility];  Service: Ophthalmology;  Laterality: Right;   COLONOSCOPY  2018   Dr Mechele Collin   COLONOSCOPY WITH PROPOFOL N/A 06/11/2022   Procedure: COLONOSCOPY WITH PROPOFOL;  Surgeon: Regis Bill, MD;  Location: [facility];  Service: Endoscopy;  Laterality: N/A;   HAND SURGERY Right    LYMPH NODE DISSECTION     removed from back oh neck   NASAL SINUS SURGERY     TUBAL LIGATION     WRIST SURGERY Left 2022    SOCIAL HISTORY: Social History   Socioeconomic History   Marital status: Married    Spouse name: Not on file   Number of children: 1   Years of education: Not on file   Highest education level: Not on file  Occupational History   Not on file  Tobacco Use   Smoking status: Never   Smokeless  tobacco: Never   Tobacco comments:    (tried in college)  Vaping Use   Vaping status: Never Used  Substance and Sexual Activity   Alcohol use: Not Currently    Alcohol/week: 0.0 - 1.0 standard drinks of alcohol    Comment: OCC WINE   Drug use: No   Sexual activity: Not on file  Other Topics Concern   Not on file  Social History Narrative   Not on file   Social Drivers of Corporate investment banker  Strain: Low Risk  (06/30/2023)   Received [facility]   Overall Financial Resource Strain (CARDIA)    Difficulty of Paying Living Expenses: Not hard at all  Food Insecurity: No Food Insecurity (07/02/2023)   Hunger Vital Sign    Worried About Running Out of Food in the Last Year: Never true    Ran Out of Food in the Last Year: Never true  Transportation Needs: No Transportation Needs (07/02/2023)   PRAPARE - Administrator, Civil Service (Medical): No    Lack of Transportation (Non-Medical): No  Physical Activity: Not on file  Stress: Not on file  Social Connections: Not on file  Intimate Partner Violence: Not At Risk (07/02/2023)   Humiliation, Afraid, Rape, and Kick questionnaire    Fear of Current or Ex-Partner: No    Emotionally Abused: No    Physically Abused: No    Sexually Abused: No    FAMILY HISTORY: Family History  Problem Relation Age of Onset   Hypertension Mother    CVA Mother    Thyroid disease Mother    Seizures Mother    Stroke Father    Heart attack Father    Alcohol abuse Sister    Breast cancer Maternal Grandmother    Heart attack Paternal Grandmother    CVA Paternal Grandmother    CVA Paternal Grandfather    Heart attack Paternal Grandfather    Colon cancer Neg Hx     ALLERGIES:  is allergic to actonel [risedronate sodium], alendronate, clarithromycin, erythromycin, other, sulfa antibiotics, bactrim [sulfamethoxazole-trimethoprim], and grapeseed extract [nutritional supplements].  MEDICATIONS:  Current Outpatient  Medications  Medication Sig Dispense Refill   acetaminophen (TYLENOL) 500 MG tablet Take 500 mg by mouth every 6 (six) hours as needed for moderate pain or headache.      amlodipine-atorvastatin (CADUET) 10-10 MG tablet amlodipine 10 mg-atorvastatin 10 mg tablet  Take 1 tablet every day by oral route.     CALCIUM-VITAMIN D PO Take 1 tablet by mouth daily.      folic acid (FOLVITE) 1 MG tablet Take 1 mg by mouth daily.     meloxicam (MOBIC) 15 MG tablet Take 15 mg by mouth daily.     methotrexate (RHEUMATREX) 2.5 MG tablet Take 15 mg by mouth.     omeprazole (PRILOSEC) 20 MG capsule TAKE 1 CAPSULE BY MOUTH EVERY DAY 180 capsule 1   Polyethyl Glycol-Propyl Glycol 0.4-0.3 % SOLN Place 1-2 drops into both eyes 3 (three) times daily as needed (for dry eyes).     predniSONE (DELTASONE) 5 MG tablet Take 5 mg by mouth daily with breakfast.     rosuvastatin (CRESTOR) 5 MG tablet TAKE ONE TABLET (5 MG) BY MOUTH EVERY DAY 90 tablet 3   Zoledronic Acid (RECLAST IV) Inject into the vein.     zolpidem (AMBIEN) 10 MG tablet TAKE ONE-HALF TABLET BY MOUTH AT BEDTIME. 30 tablet 1   ALPRAZolam (XANAX) 0.5 MG tablet TAKE ONE TABLET BY MOUTH TWICE DAILY AS NEEDED FOR ANXIETY (Patient not taking: Reported on 06/11/2022) 60 tablet 0   levocetirizine (XYZAL) 5 MG tablet Take 1 tablet (5 mg total) by mouth every evening. (Patient not taking: Reported on 07/02/2023) 90 tablet 3   No current facility-administered medications for this visit.    Review of Systems  Constitutional:  Negative for appetite change, chills, fatigue and fever.  HENT:   Negative for hearing loss and voice change.   Eyes:  Negative for eye  problems.  Respiratory:  Negative for chest tightness and cough.   Cardiovascular:  Negative for chest pain.  Gastrointestinal:  Negative for abdominal distention, abdominal pain and blood in stool.  Endocrine: Negative for hot flashes.  Genitourinary:  Negative for difficulty urinating and frequency.    Musculoskeletal:  Positive for back pain. Negative for arthralgias.  Skin:  Negative for itching and rash.  Neurological:  Negative for extremity weakness.  Hematological:  Negative for adenopathy.  Psychiatric/Behavioral:  Negative for confusion.    PHYSICAL EXAMINATION: ECOG PERFORMANCE STATUS: 0 - Asymptomatic Vitals:   07/02/23 0937 07/02/23 0944  BP: (!) 153/82 135/79  Pulse: 85   Resp: 20   Temp: (!) 96.5 F (35.8 C)   SpO2: 100%    Filed Weights   07/02/23 0937  Weight: 122 lb 12.8 oz (55.7 kg)    Physical Exam Constitutional:      General: She is not in acute distress. HENT:     Head: Normocephalic and atraumatic.  Eyes:     General: No scleral icterus. Cardiovascular:     Rate and Rhythm: Normal rate and regular rhythm.     Heart sounds: Normal heart sounds.  Pulmonary:     Effort: Pulmonary effort is normal. No respiratory distress.  Abdominal:     General: Bowel sounds are normal. There is no distension.     Palpations: Abdomen is soft.  Musculoskeletal:        General: No deformity. Normal range of motion.     Cervical back: Normal range of motion and neck supple.  Skin:    General: Skin is warm and dry.     Findings: No erythema or rash.  Neurological:     Mental Status: She is alert and oriented to person, place, and time. Mental status is at baseline.  Psychiatric:        Mood and Affect: Mood normal.      LABORATORY DATA:  I have reviewed the data as listed    Latest Ref Rng & Units 01/09/2021   11:12 AM 12/30/2019    9:38 AM 10/20/2018    8:19 AM  CBC  WBC 3.4 - 10.8 x10E3/uL 4.0  3.4  3.5   Hemoglobin 11.1 - 15.9 g/dL 16.1  09.6  04.5   Hematocrit 34.0 - 46.6 % 34.3  34.7  33.6   Platelets 150 - 450 x10E3/uL 207  193  223       Latest Ref Rng & Units 01/09/2021   11:12 AM 12/30/2019    9:38 AM 10/20/2018    8:19 AM  CMP  Glucose 65 - 99 mg/dL 97  91  89   BUN 8 - 27 mg/dL 13  22  18    Creatinine 0.57 - 1.00 mg/dL 4.09  8.11  9.14    Sodium 134 - 144 mmol/L 142  140  140   Potassium 3.5 - 5.2 mmol/L 4.4  4.5  4.3   Chloride 96 - 106 mmol/L 107  105  105   CO2 20 - 29 mmol/L 23  23  22    Calcium 8.7 - 10.3 mg/dL 9.6  9.4  9.4   Total Protein 6.0 - 8.5 g/dL 6.5  6.3  6.5   Total Bilirubin 0.0 - 1.2 mg/dL 0.3  0.3  0.4   Alkaline Phos 44 - 121 IU/L 51  51  57   AST 0 - 40 IU/L 16  16  15    ALT 0 - 32 IU/L 15  22  10             RADIOGRAPHIC STUDIES: I have personally reviewed the radiological images as listed and agreed with the findings in the report.  No results found.

## 2023-07-02 NOTE — Assessment & Plan Note (Signed)
 ER/PR negative. S/p lumpectomy and radiation.  Continue annual mammogram, ordered by her PCP

## 2023-07-02 NOTE — Assessment & Plan Note (Addendum)
 Labs are reviewed and discussed with patient. Concerning for plasma cell disorders.  Recommend checking cbc cmp myeloma panel, light chain ratio, beta2 microglobulin, LDH, 24 h UPEP Recommend bone marrow biopsy and skeletal survey

## 2023-07-03 LAB — KAPPA/LAMBDA LIGHT CHAINS
Kappa free light chain: 6.8 mg/L (ref 3.3–19.4)
Kappa, lambda light chain ratio: 0.04 — ABNORMAL LOW (ref 0.26–1.65)
Lambda free light chains: 163.8 mg/L — ABNORMAL HIGH (ref 5.7–26.3)

## 2023-07-03 LAB — BETA 2 MICROGLOBULIN, SERUM: Beta-2 Microglobulin: 1.9 mg/L (ref 0.6–2.4)

## 2023-07-04 ENCOUNTER — Other Ambulatory Visit: Payer: Self-pay

## 2023-07-04 DIAGNOSIS — D472 Monoclonal gammopathy: Secondary | ICD-10-CM

## 2023-07-08 LAB — IFE+PROTEIN ELECTRO, 24-HR UR
% BETA, Urine: 0 %
ALPHA 1 URINE: 0 %
Albumin, U: 100 %
Alpha 2, Urine: 0 %
GAMMA GLOBULIN URINE: 0 %
Total Protein, Urine-Ur/day: 94 mg/(24.h) (ref 30–150)
Total Protein, Urine: 5.5 mg/dL
Total Volume: 1700

## 2023-07-08 LAB — MULTIPLE MYELOMA PANEL, SERUM
Albumin SerPl Elph-Mcnc: 3.6 g/dL (ref 2.9–4.4)
Albumin/Glob SerPl: 1.1 (ref 0.7–1.7)
Alpha 1: 0.3 g/dL (ref 0.0–0.4)
Alpha2 Glob SerPl Elph-Mcnc: 0.8 g/dL (ref 0.4–1.0)
B-Globulin SerPl Elph-Mcnc: 1 g/dL (ref 0.7–1.3)
Gamma Glob SerPl Elph-Mcnc: 1.5 g/dL (ref 0.4–1.8)
Globulin, Total: 3.6 g/dL (ref 2.2–3.9)
IgA: 14 mg/dL — ABNORMAL LOW (ref 64–422)
IgG (Immunoglobin G), Serum: 1598 mg/dL (ref 586–1602)
IgM (Immunoglobulin M), Srm: 12 mg/dL — ABNORMAL LOW (ref 26–217)
M Protein SerPl Elph-Mcnc: 1.3 g/dL — ABNORMAL HIGH
Total Protein ELP: 7.2 g/dL (ref 6.0–8.5)

## 2023-07-10 ENCOUNTER — Telehealth: Payer: Self-pay

## 2023-07-10 ENCOUNTER — Other Ambulatory Visit: Payer: Self-pay

## 2023-07-10 DIAGNOSIS — D649 Anemia, unspecified: Secondary | ICD-10-CM

## 2023-07-10 DIAGNOSIS — D472 Monoclonal gammopathy: Secondary | ICD-10-CM

## 2023-07-10 NOTE — Telephone Encounter (Signed)
 Pt accepts appt for bm bx for Tues 3/25 @ 8:30a, arrive 7:30.   Please schedule MD (for bx results) on April 4 @ 12p.   Spoke to pt and she is aware of both appts and will also see them on Mychart.

## 2023-07-10 NOTE — Telephone Encounter (Signed)
-----   Message from Rickard Patience sent at 07/09/2023 11:51 PM EDT ----- Please let patient know that I have reviewed her blood results. I recommend bone marrow biopsy for further evaluation please arrange her to see me 8-14 days after biopsy. Thanks.

## 2023-07-10 NOTE — Telephone Encounter (Signed)
 Called pt, no answer, detailed VM left. Mychart also sent and will wait for pt response prior to scheduling BmBx

## 2023-07-21 ENCOUNTER — Other Ambulatory Visit: Payer: Self-pay

## 2023-07-21 DIAGNOSIS — Z01818 Encounter for other preprocedural examination: Secondary | ICD-10-CM

## 2023-07-21 NOTE — Progress Notes (Signed)
 Patient for IR Bone Marrow Biopsy on Tues 07/22/23, I called and spoke with the patient on the phone and gave pre-procedure instructions. Pt was made aware to be here at 7:30a, NPO after MN prior to procedure as well as driver post procedure/recovery/discharge. Pt stated understanding.  Called 07/21/23

## 2023-07-21 NOTE — H&P (Signed)
 Chief Complaint: Normocytic anemia; monoclonal gammopathy; request for image guided bone marrow biopsy  Referring Provider(s): Yu,Zhou   Supervising Physician: {Supervising Physician:[id number]}  Patient Status: {IR Consult Patient Status:21879}  History of Present Illness: Sherri Cisneros is a 78 y.o. female with a PMH of BCA, arthritis, DCIS, polymyalgia, raynaud's syndrome, vertigo. Her PCP noted indications of monoclonal gammopathy and normocytic anemia which prompted referral to oncology. IR was consulted by Dr. Cathie Hoops for image guided bone marrow biopsy to guide diagnosis and treatment    Patient is Full Code  Past Medical History:  Diagnosis Date   Anxiety    Arthritis    Breast cancer (HCC)    Cancer (HCC)    DCIS, high-grade.  ER/PR negative   Colon polyp 2018   GERD (gastroesophageal reflux disease)    Hematuria    Osteoporosis    Personal history of radiation therapy    Plantar fasciitis 2016, 2019   right foot   Polymyalgia (HCC)    Raynaud's syndrome    Sinus headache    Vertigo    no episodes in several years    Past Surgical History:  Procedure Laterality Date   BREAST BIOPSY Right 01/21/2018   affirm bx ribbon clip DUCTAL CARCINOMA IN SITU, HIGH-GRADE COMEDO TYPE     BREAST EXCISIONAL BIOPSY Right 1998   negative, Dr Lemar Livings   BREAST LUMPECTOMY Right 02/06/2018   Procedure: BREAST WIDE EXCISION;  Surgeon: Earline Mayotte, MD;  Location: [facility];  Service: General;  Laterality: Right;   CATARACT EXTRACTION W/PHACO Left 11/10/2018   Procedure: CATARACT EXTRACTION PHACO AND INTRAOCULAR LENS PLACEMENT (IOC)  LEFT;  Surgeon: Galen Manila, MD;  Location: [facility];  Service: Ophthalmology;  Laterality: Left;   CATARACT EXTRACTION W/PHACO Right 12/01/2018   Procedure: CATARACT EXTRACTION PHACO AND INTRAOCULAR LENS PLACEMENT (IOC)  RIGHT;  Surgeon: Galen Manila, MD;  Location: [facility];  Service: Ophthalmology;  Laterality: Right;    COLONOSCOPY  2018   Dr Mechele Collin   COLONOSCOPY WITH PROPOFOL N/A 06/11/2022   Procedure: COLONOSCOPY WITH PROPOFOL;  Surgeon: Regis Bill, MD;  Location: [facility];  Service: Endoscopy;  Laterality: N/A;   HAND SURGERY Right    LYMPH NODE DISSECTION     removed from back oh neck   NASAL SINUS SURGERY     TUBAL LIGATION     WRIST SURGERY Left 2022    Allergies: Actonel [risedronate sodium], Alendronate, Clarithromycin, Erythromycin, Other, Sulfa antibiotics, Bactrim [sulfamethoxazole-trimethoprim], and Grapeseed extract [nutritional supplements]  Medications: Prior to Admission medications   Medication Sig Start Date End Date Taking? Authorizing Provider  acetaminophen (TYLENOL) 500 MG tablet Take 500 mg by mouth every 6 (six) hours as needed for moderate pain or headache.     [provider]  ALPRAZolam Prudy Feeler) 0.5 MG tablet TAKE ONE TABLET BY MOUTH TWICE DAILY AS NEEDED FOR ANXIETY Patient not taking: Reported on 06/11/2022 07/31/20   Erasmo Downer, MD  amlodipine-atorvastatin (CADUET) 10-10 MG tablet amlodipine 10 mg-atorvastatin 10 mg tablet  Take 1 tablet every day by oral route.    [provider]  CALCIUM-VITAMIN D PO Take 1 tablet by mouth daily.  07/09/11   [provider]  folic acid (FOLVITE) 1 MG tablet Take 1 mg by mouth daily.    [provider]  levocetirizine (XYZAL) 5 MG tablet Take 1 tablet (5 mg total) by mouth every evening. Patient not taking: Reported on 07/02/2023 01/15/21   Bosie Clos,  MD  meloxicam (MOBIC) 15 MG tablet Take 15 mg by mouth daily. 08/25/19   [provider]  methotrexate (RHEUMATREX) 2.5 MG tablet Take 15 mg by mouth. 05/29/23   [provider]  omeprazole (PRILOSEC) 20 MG capsule TAKE 1 CAPSULE BY MOUTH EVERY DAY 10/08/21   Bosie Clos, MD  Polyethyl Glycol-Propyl Glycol 0.4-0.3 % SOLN Place 1-2 drops into both eyes 3 (three) times daily as needed (for dry eyes).     [provider]  predniSONE (DELTASONE) 5 MG tablet Take 5 mg by mouth daily with breakfast.    [provider]  rosuvastatin (CRESTOR) 5 MG tablet TAKE ONE TABLET (5 MG) BY MOUTH EVERY DAY 10/29/21   Bosie Clos, MD  Zoledronic Acid (RECLAST IV) Inject into the vein.    [provider]  zolpidem (AMBIEN) 10 MG tablet TAKE ONE-HALF TABLET BY MOUTH AT BEDTIME. 01/02/22   Bosie Clos, MD     Family History  Problem Relation Age of Onset   Hypertension Mother    CVA Mother    Thyroid disease Mother    Seizures Mother    Stroke Father    Heart attack Father    Alcohol abuse Sister    Breast cancer Maternal Grandmother    Heart attack Paternal Grandmother    CVA Paternal Grandmother    CVA Paternal Grandfather    Heart attack Paternal Grandfather    Colon cancer Neg Hx     Social History   Socioeconomic History   Marital status: Married    Spouse name: Not on file   Number of children: 1   Years of education: Not on file   Highest education level: Not on file  Occupational History   Not on file  Tobacco Use   Smoking status: Never   Smokeless tobacco: Never   Tobacco comments:    (tried in college)  Vaping Use   Vaping status: Never Used  Substance and Sexual Activity   Alcohol use: Not Currently    Alcohol/week: 0.0 - 1.0 standard drinks of alcohol    Comment: OCC WINE   Drug use: No   Sexual activity: Not on file  Other Topics Concern   Not on file  Social History Narrative   Not on file   Social Drivers of Health   Financial Resource Strain: Low Risk  (06/30/2023)   Received from [facility]   Overall Financial Resource Strain (CARDIA)    Difficulty of Paying Living Expenses: Not hard at all  Food Insecurity: No Food Insecurity (07/02/2023)   Hunger Vital Sign    Worried About Running Out of Food in the Last Year: Never true    Ran Out of Food in the Last Year: Never true  Transportation Needs: No  Transportation Needs (07/02/2023)   PRAPARE - Administrator, Civil Service (Medical): No    Lack of Transportation (Non-Medical): No  Physical Activity: Not on file  Stress: Not on file  Social Connections: Not on file      Review of Systems  Vital Signs: There were no vitals taken for this visit.  Advance Care Plan: No documents on file    Physical Exam  Imaging: DG Bone Survey Met Result Date: 07/16/2023 CLINICAL DATA:  Monoclonal gammopathy EXAM: METASTATIC BONE SURVEY COMPARISON:  None Available. FINDINGS: Lateral view of the skull demonstrates ill-defined lytic area in the posterior parietooccipital distribution on the lateral view not. No AP view was  submitted. The cervical, thoracic and lumbar spine demonstrates no obvious fractures or lytic lesions. The pelvis demonstrates no obvious lytic lesions. Lung bones demonstrates no obvious lytic lesions. AP view of the chest demonstrates no infiltrates. No obvious bony lesions in the ribs or clavicles Heart and mediastinum normal Electronically Signed   By: Shaaron Adler M.D.   On: 07/16/2023 20:16    Labs:  CBC: Recent Labs    07/02/23 1046  WBC 3.8*  HGB 11.8*  HCT 36.8  PLT 225    COAGS: No results for input(s): "INR", "APTT" in the last 8760 hours.  BMP: Recent Labs    07/02/23 1046  NA 134*  K 3.9  CL 104  CO2 24  GLUCOSE 119*  BUN 19  CALCIUM 9.7  CREATININE 0.86  GFRNONAA >60    LIVER FUNCTION TESTS: Recent Labs    07/02/23 1046  BILITOT 0.7  AST 28  ALT 30  ALKPHOS 28*  PROT 7.5  ALBUMIN 3.7    TUMOR MARKERS: No results for input(s): "AFPTM", "CEA", "CA199", "CHROMGRNA" in the last 8760 hours.  Assessment and Plan:  Normocytic anemia; monoclonal gammopathy; request for image guided bone marrow biopsy - 3/25 labs pending - not on a blood thinner - mobic - NPO after MN  Risks and benefits of image guided bone marrow biopsy was discussed with the patient and/or patient's  family including, but not limited to bleeding, infection, damage to adjacent structures or low yield requiring additional tests.  All of the questions were answered and there is agreement to proceed.  Consent signed and in chart.    Thank you for allowing our service to participate in ARION MORGAN 's care.    Electronically Signed: Carlton Adam, NP   07/21/2023, 2:40 PM     I spent a total of  15 Minutes   in face to face in clinical consultation, greater than 50% of which was counseling/coordinating care for image guided bone marrow biopsy for monoclonal gammopathy   (A copy of this note was sent to the referring provider and the time of visit.)

## 2023-07-22 ENCOUNTER — Ambulatory Visit
Admission: RE | Admit: 2023-07-22 | Discharge: 2023-07-22 | Disposition: A | Discharge status: Home / Self Care | Source: Ambulatory Visit | Attending: Oncology | Admitting: Oncology

## 2023-07-22 ENCOUNTER — Other Ambulatory Visit: Payer: Self-pay

## 2023-07-22 ENCOUNTER — Encounter: Payer: Self-pay | Admitting: Radiology

## 2023-07-22 DIAGNOSIS — R897 Abnormal histological findings in specimens from other organs, systems and tissues: Secondary | ICD-10-CM | POA: Diagnosis not present

## 2023-07-22 DIAGNOSIS — D472 Monoclonal gammopathy: Secondary | ICD-10-CM | POA: Diagnosis present

## 2023-07-22 DIAGNOSIS — D649 Anemia, unspecified: Secondary | ICD-10-CM | POA: Diagnosis not present

## 2023-07-22 DIAGNOSIS — Z01818 Encounter for other preprocedural examination: Secondary | ICD-10-CM

## 2023-07-22 HISTORY — PX: IR BONE MARROW BIOPSY & ASPIRATION: IMG5727

## 2023-07-22 LAB — PROTIME-INR
INR: 0.9 (ref 0.8–1.2)
Prothrombin Time: 12.4 s (ref 11.4–15.2)

## 2023-07-22 LAB — CBC
HCT: 36.1 % (ref 36.0–46.0)
Hemoglobin: 11.6 g/dL — ABNORMAL LOW (ref 12.0–15.0)
MCH: 30.6 pg (ref 26.0–34.0)
MCHC: 32.1 g/dL (ref 30.0–36.0)
MCV: 95.3 fL (ref 80.0–100.0)
Platelets: 233 10*3/uL (ref 150–400)
RBC: 3.79 MIL/uL — ABNORMAL LOW (ref 3.87–5.11)
RDW: 17.6 % — ABNORMAL HIGH (ref 11.5–15.5)
WBC: 4.2 10*3/uL (ref 4.0–10.5)
nRBC: 0 % (ref 0.0–0.2)

## 2023-07-22 MED ORDER — MIDAZOLAM HCL 2 MG/2ML IJ SOLN
INTRAMUSCULAR | Status: AC | PRN
  Administered 2023-07-22: 1 mg via INTRAVENOUS

## 2023-07-22 MED ORDER — HEPARIN SOD (PORK) LOCK FLUSH 100 UNIT/ML IV SOLN
500.0000 [IU] | Freq: Once | INTRAVENOUS | Status: AC
  Administered 2023-07-22: 200 [IU] via INTRAVENOUS

## 2023-07-22 MED ORDER — SODIUM CHLORIDE 0.9 % IV SOLN: INTRAVENOUS | Status: DC

## 2023-07-22 MED ORDER — FENTANYL CITRATE (PF) 100 MCG/2ML IJ SOLN
INTRAMUSCULAR | Status: AC | PRN
  Administered 2023-07-22: 50 ug via INTRAVENOUS

## 2023-07-22 MED ORDER — LIDOCAINE 1 % OPTIME INJ - NO CHARGE
10.0000 mL | Freq: Once | INTRAMUSCULAR | Status: AC
  Administered 2023-07-22: 10 mL
  Filled 2023-07-22: qty 10

## 2023-07-22 MED ORDER — HEPARIN SOD (PORK) LOCK FLUSH 100 UNIT/ML IV SOLN
INTRAVENOUS | Status: AC
  Filled 2023-07-22: qty 5

## 2023-07-22 MED ORDER — FENTANYL CITRATE (PF) 100 MCG/2ML IJ SOLN
INTRAMUSCULAR | Status: AC
Start: 2023-07-22 — End: ?
  Filled 2023-07-22: qty 2

## 2023-07-22 MED ORDER — MIDAZOLAM HCL 2 MG/2ML IJ SOLN
INTRAMUSCULAR | Status: AC
  Filled 2023-07-22: qty 2

## 2023-07-22 NOTE — Procedures (Signed)
 Interventional Radiology Procedure Note  Procedure: image guided aspirate and core biopsy of left iliac bone Complications: None Recommendations: - Bedrest supine x 1 hrs - OTC's PRN  Pain - Follow biopsy results - ok to restart all home meds  Signed,  Yvone Neu. Loreta Ave, DO

## 2023-07-23 ENCOUNTER — Other Ambulatory Visit

## 2023-07-24 LAB — SURGICAL PATHOLOGY

## 2023-07-30 ENCOUNTER — Encounter (HOSPITAL_COMMUNITY): Payer: Self-pay | Admitting: Oncology

## 2023-07-31 ENCOUNTER — Encounter (HOSPITAL_COMMUNITY): Payer: Self-pay | Admitting: Oncology

## 2023-07-31 ENCOUNTER — Ambulatory Visit: Admit: 2023-07-31 | Admitting: Surgery

## 2023-07-31 SURGERY — BIOPSY TEMPORAL ARTERY
Anesthesia: General | Site: Face | Laterality: Right

## 2023-08-01 ENCOUNTER — Ambulatory Visit: Admitting: Oncology
